# Patient Record
Sex: Male | Born: 1955 | Race: White | Hispanic: No | Marital: Single | Smoking: Never smoker
Health system: Southern US, Community
[De-identification: ages and names within clinical notes are randomized; demographics above are authoritative.]

## PROBLEM LIST (undated history)

## (undated) DIAGNOSIS — N400 Enlarged prostate without lower urinary tract symptoms: Secondary | ICD-10-CM

## (undated) DIAGNOSIS — M1990 Unspecified osteoarthritis, unspecified site: Secondary | ICD-10-CM

## (undated) DIAGNOSIS — I10 Essential (primary) hypertension: Secondary | ICD-10-CM

## (undated) DIAGNOSIS — K219 Gastro-esophageal reflux disease without esophagitis: Secondary | ICD-10-CM

## (undated) DIAGNOSIS — I719 Aortic aneurysm of unspecified site, without rupture: Secondary | ICD-10-CM

## (undated) HISTORY — PX: FINGER SURGERY: SHX640

## (undated) HISTORY — DX: Gastro-esophageal reflux disease without esophagitis: K21.9

## (undated) HISTORY — DX: Aortic aneurysm of unspecified site, without rupture: I71.9

## (undated) HISTORY — DX: Benign prostatic hyperplasia without lower urinary tract symptoms: N40.0

## (undated) HISTORY — PX: HERNIA REPAIR: SHX51

## (undated) HISTORY — PX: ELBOW SURGERY: SHX618

## (undated) HISTORY — DX: Essential (primary) hypertension: I10

## (undated) HISTORY — PX: OTHER SURGICAL HISTORY: SHX169

## (undated) HISTORY — DX: Unspecified osteoarthritis, unspecified site: M19.90

---

## 2013-08-21 DIAGNOSIS — M545 Low back pain: Secondary | ICD-10-CM

## 2013-08-21 DIAGNOSIS — G8929 Other chronic pain: Secondary | ICD-10-CM | POA: Insufficient documentation

## 2015-07-26 ENCOUNTER — Other Ambulatory Visit: Payer: Self-pay | Admitting: Physical Medicine and Rehabilitation

## 2015-07-26 DIAGNOSIS — M5416 Radiculopathy, lumbar region: Secondary | ICD-10-CM

## 2015-08-04 ENCOUNTER — Ambulatory Visit (HOSPITAL_COMMUNITY)
Admission: RE | Admit: 2015-08-04 | Discharge: 2015-08-04 | Disposition: A | Discharge status: Home / Self Care | Payer: Managed Care, Other (non HMO) | Source: Ambulatory Visit | Attending: Physical Medicine and Rehabilitation | Admitting: Physical Medicine and Rehabilitation

## 2015-08-04 DIAGNOSIS — M5117 Intervertebral disc disorders with radiculopathy, lumbosacral region: Secondary | ICD-10-CM | POA: Diagnosis not present

## 2015-08-04 DIAGNOSIS — M4727 Other spondylosis with radiculopathy, lumbosacral region: Secondary | ICD-10-CM | POA: Diagnosis not present

## 2015-08-04 DIAGNOSIS — M5416 Radiculopathy, lumbar region: Secondary | ICD-10-CM | POA: Insufficient documentation

## 2015-08-04 DIAGNOSIS — G8929 Other chronic pain: Secondary | ICD-10-CM | POA: Insufficient documentation

## 2015-08-04 DIAGNOSIS — M545 Low back pain: Secondary | ICD-10-CM | POA: Insufficient documentation

## 2015-08-04 DIAGNOSIS — Q631 Lobulated, fused and horseshoe kidney: Secondary | ICD-10-CM | POA: Insufficient documentation

## 2015-08-13 DIAGNOSIS — M5416 Radiculopathy, lumbar region: Secondary | ICD-10-CM | POA: Insufficient documentation

## 2015-08-13 DIAGNOSIS — M5126 Other intervertebral disc displacement, lumbar region: Secondary | ICD-10-CM | POA: Insufficient documentation

## 2017-05-18 ENCOUNTER — Encounter: Payer: Self-pay | Admitting: Urology

## 2017-05-18 ENCOUNTER — Ambulatory Visit (INDEPENDENT_AMBULATORY_CARE_PROVIDER_SITE_OTHER): Payer: Managed Care, Other (non HMO) | Admitting: Urology

## 2017-05-18 VITALS — BP 219/93 | HR 75 | Ht 73.0 in | Wt 212.3 lb

## 2017-05-18 DIAGNOSIS — Z125 Encounter for screening for malignant neoplasm of prostate: Secondary | ICD-10-CM | POA: Diagnosis not present

## 2017-05-18 DIAGNOSIS — N400 Enlarged prostate without lower urinary tract symptoms: Secondary | ICD-10-CM

## 2017-05-18 LAB — BLADDER SCAN AMB NON-IMAGING

## 2017-05-18 MED ORDER — ALFUZOSIN HCL ER 10 MG PO TB24: 10.0000 mg | ORAL_TABLET | Freq: Every day | ORAL | 11 refills | Status: DC

## 2017-05-18 NOTE — Progress Notes (Addendum)
05/18/2017 3:17 PM   Reginald Mckee 04-08-1956 [id number]  Referring provider: No referring provider defined for this encounter.  CC: BPH  HPI: The patient is a 61 year old gentleman who presents today for evaluation of his obstructive voiding symptoms.  The patient complains of nocturia x4.  He has a very slow stream.  He does not feel like he empties his bladder.  He denies significant straining.  He is increasingly bothered by his symptoms.  He also notes daytime frequency.  He has never tried medication for this before.  He has no recent PSA values.  He denies history of gross hematuria, nephrolithiasis, and UTI.  PVR: 50 cc  PMH: Past Medical History:  Diagnosis Date  . Acid reflux     Surgical History: Past Surgical History:  Procedure Laterality Date  . ELBOW SURGERY Right   . HERNIA REPAIR      Home Medications:  Allergies as of 05/18/2017   Not on File     Medication List    as of 05/18/2017  3:17 PM   You have not been prescribed any medications.     Allergies: Not on File  Family History: No family history on file.  Social History:  has no tobacco, alcohol, and drug history on file.  ROS: UROLOGY Frequent Urination?: Yes Hard to postpone urination?: Yes Burning/pain with urination?: No Get up at night to urinate?: Yes Leakage of urine?: Yes Urine stream starts and stops?: Yes Trouble starting stream?: Yes Do you have to strain to urinate?: No Blood in urine?: No Urinary tract infection?: No Sexually transmitted disease?: No Injury to kidneys or bladder?: No Painful intercourse?: No Weak stream?: Yes Erection problems?: Yes Penile pain?: No  Gastrointestinal Nausea?: No Vomiting?: No Indigestion/heartburn?: No Diarrhea?: No Constipation?: No  Constitutional Fever: No Night sweats?: No Weight loss?: No Fatigue?: No  Skin Skin rash/lesions?: No Itching?: No  Eyes Blurred vision?: No Double vision?:  No  Ears/Nose/Throat Sore throat?: No Sinus problems?: No  Hematologic/Lymphatic Swollen glands?: No Easy bruising?: No  Cardiovascular Leg swelling?: No Chest pain?: No  Respiratory Cough?: No Shortness of breath?: No  Endocrine Excessive thirst?: No  Musculoskeletal Back pain?: Yes Joint pain?: No  Neurological Headaches?: No Dizziness?: No  Psychologic Depression?: No Anxiety?: No  Physical Exam: BP (!) 219/93 (BP Location: Right Arm, Patient Position: Sitting, Cuff Size: Normal)   Pulse 75   Ht 6\' 1"  (1.854 m)   Wt 212 lb 4.8 oz (96.3 kg)   BMI 28.01 kg/m   Constitutional:  Alert and oriented, No acute distress. HEENT: [place] AT, moist mucus membranes.  Trachea midline, no masses. Cardiovascular: No clubbing, cyanosis, or edema. Respiratory: Normal respiratory effort, no increased work of breathing. GI: Abdomen is soft, nontender, nondistended, no abdominal masses GU: No CVA tenderness.  DRE 2+ smooth benign Skin: No rashes, bruises or suspicious lesions. Lymph: No cervical or inguinal adenopathy. Neurologic: Grossly intact, no focal deficits, moving all 4 extremities. Psychiatric: Normal mood and affect.  Laboratory Data: No results found for: WBC, HGB, HCT, MCV, PLT  No results found for: CREATININE  No results found for: PSA  No results found for: TESTOSTERONE  No results found for: HGBA1C  Urinalysis No results found for: COLORURINE, APPEARANCEUR, LABSPEC, PHURINE, GLUCOSEU, HGBUR, BILIRUBINUR, KETONESUR, PROTEINUR, UROBILINOGEN, NITRITE, LEUKOCYTESUR   Assessment & Plan:    1.  BPH We will start the patient on Uroxatrol which is what his insurance company prefers for alpha inhibitors.  He will follow-up in  3 months to assess his progress.  2.  Prostate cancer screening Due for PSA today.    Return in about 3 months (around 08/18/2017).  Nickie Retort, MD  [facility] [address] [place], [place] [address] [phone]

## 2017-05-19 LAB — URINALYSIS, COMPLETE
Bilirubin, UA: NEGATIVE
GLUCOSE, UA: NEGATIVE
KETONES UA: NEGATIVE
LEUKOCYTES UA: NEGATIVE
Nitrite, UA: NEGATIVE
RBC, UA: NEGATIVE
SPEC GRAV UA: 1.025 (ref 1.005–1.030)
Urobilinogen, Ur: 1 mg/dL (ref 0.2–1.0)
pH, UA: 5.5 (ref 5.0–7.5)

## 2017-05-19 LAB — MICROSCOPIC EXAMINATION
Epithelial Cells (non renal): NONE SEEN /hpf (ref 0–10)
RBC, UA: NONE SEEN /hpf (ref 0–?)
WBC UA: NONE SEEN /HPF (ref 0–?)

## 2017-05-19 LAB — FPSA% REFLEX
% FREE PSA: 23.6 %
PSA, FREE: 1.51 ng/mL

## 2017-05-19 LAB — PSA TOTAL (REFLEX TO FREE): Prostate Specific Ag, Serum: 6.4 ng/mL — ABNORMAL HIGH (ref 0.0–4.0)

## 2017-06-01 ENCOUNTER — Telehealth: Payer: Self-pay

## 2017-06-01 DIAGNOSIS — R972 Elevated prostate specific antigen [PSA]: Secondary | ICD-10-CM

## 2017-06-01 NOTE — Telephone Encounter (Signed)
Reginald Retort, MD  Lestine Box, LPN         Please let patient know his PSA was slightly elevated. He is already scheduled to follow-up in 3 months. He needs a PSA checked 1 week prior to this follow-up appointment to further workup the slight elevation. Thanks.    Spoke with pt in reference to PSA results and needing labs prior to 65mo f/u. Pt voiced understanding. Lab and OV appt made. Orders placed.

## 2017-07-27 ENCOUNTER — Other Ambulatory Visit: Payer: Managed Care, Other (non HMO)

## 2017-07-27 DIAGNOSIS — R972 Elevated prostate specific antigen [PSA]: Secondary | ICD-10-CM

## 2017-07-28 LAB — PSA: Prostate Specific Ag, Serum: 4.8 ng/mL — ABNORMAL HIGH (ref 0.0–4.0)

## 2017-08-10 ENCOUNTER — Encounter: Payer: Self-pay | Admitting: Urology

## 2017-08-10 ENCOUNTER — Ambulatory Visit (INDEPENDENT_AMBULATORY_CARE_PROVIDER_SITE_OTHER): Payer: Managed Care, Other (non HMO) | Admitting: Urology

## 2017-08-10 VITALS — BP 201/91 | HR 84 | Ht 73.0 in | Wt 227.4 lb

## 2017-08-10 DIAGNOSIS — N3281 Overactive bladder: Secondary | ICD-10-CM | POA: Diagnosis not present

## 2017-08-10 DIAGNOSIS — R972 Elevated prostate specific antigen [PSA]: Secondary | ICD-10-CM

## 2017-08-10 DIAGNOSIS — N400 Enlarged prostate without lower urinary tract symptoms: Secondary | ICD-10-CM

## 2017-08-10 NOTE — Progress Notes (Signed)
08/10/2017 10:39 AM   Reginald Mckee 1956-01-22 [id number]  Referring provider: No referring provider defined for this encounter.  Chief Complaint  Patient presents with  . Benign Prostatic Hypertrophy    HPI: The patient is a 62 year old gentleman who presents today for evaluation of his obstructive voiding symptoms.    1. BPH The patient complains of nocturia x4.  He has a very slow stream.  He does not feel like he empties his bladder.  He denies significant straining.  He is increasingly bothered by his symptoms.  He also notes daytime frequency.    At his visit in November 2018 he was started on Uroxatrol for his above symptoms.  He presents today for follow-up.  This is significantly helped his obstructive voiding symptoms.  He voids well with a good stream.  His biggest concern now is his nocturia x4-5.  He does have a history of sleep apnea diagnosed many years ago.  He does not use a CPAP.  He does however report that his nocturia voids are low volume.  2. Elevated PSA Patient's PSA was checked at his original appointment in November 2018.  It was 6.4.  Therefore he underwent a repeat PSA in January 2019 which though still elevated his PSA dropped to 4.8.  His DRE in November 2018 was 2+ smooth benign.       PMH: Past Medical History:  Diagnosis Date  . Acid reflux   . Arthritis     Surgical History: Past Surgical History:  Procedure Laterality Date  . ELBOW SURGERY Right   . HERNIA REPAIR      Home Medications:  Allergies as of 08/10/2017   No Known Allergies     Medication List        Accurate as of 08/10/17 10:39 AM. Always use your most recent med list.          alfuzosin 10 MG 24 hr tablet Commonly known as:  UROXATRAL Take 1 tablet (10 mg total) daily with breakfast by mouth.   meloxicam 15 MG tablet Commonly known as:  MOBIC TK 1 T PO QD PRN   methocarbamol 500 MG tablet Commonly known as:  ROBAXIN Take 500 mg by mouth 4 (four) times  daily.       Allergies: No Known Allergies  Family History: Family History  Problem Relation Age of Onset  . Prostate cancer Neg Hx   . Bladder Cancer Neg Hx   . Kidney cancer Neg Hx     Social History:  reports that  has never smoked. He has quit using smokeless tobacco. He reports that he drinks alcohol. He reports that he does not use drugs.  ROS: UROLOGY Frequent Urination?: Yes Hard to postpone urination?: No Burning/pain with urination?: No Get up at night to urinate?: Yes Leakage of urine?: No Urine stream starts and stops?: No Trouble starting stream?: No Do you have to strain to urinate?: No Blood in urine?: No Urinary tract infection?: No Sexually transmitted disease?: No Injury to kidneys or bladder?: No Painful intercourse?: No Weak stream?: Yes Erection problems?: No Penile pain?: No  Gastrointestinal Nausea?: No Vomiting?: No Indigestion/heartburn?: No Diarrhea?: No Constipation?: No  Constitutional Fever: No Night sweats?: No Weight loss?: No Fatigue?: No  Skin Skin rash/lesions?: No Itching?: No  Eyes Blurred vision?: No Double vision?: No  Ears/Nose/Throat Sore throat?: No Sinus problems?: No  Hematologic/Lymphatic Swollen glands?: No Easy bruising?: No  Cardiovascular Leg swelling?: No Chest pain?: No  Respiratory Cough?: No  Shortness of breath?: No  Endocrine Excessive thirst?: No  Musculoskeletal Back pain?: No Joint pain?: No  Neurological Headaches?: No Dizziness?: No  Psychologic Depression?: No Anxiety?: No  Physical Exam: BP (!) 201/91   Pulse 84   Ht 6\' 1"  (1.854 m)   Wt 227 lb 6.4 oz (103.1 kg)   BMI 30.00 kg/m   Constitutional:  Alert and oriented, No acute distress. HEENT: [place] AT, moist mucus membranes.  Trachea midline, no masses. Cardiovascular: No clubbing, cyanosis, or edema. Respiratory: Normal respiratory effort, no increased work of breathing. GI: Abdomen is soft, nontender,  nondistended, no abdominal masses GU: No CVA tenderness.  Skin: No rashes, bruises or suspicious lesions. Lymph: No cervical or inguinal adenopathy. Neurologic: Grossly intact, no focal deficits, moving all 4 extremities. Psychiatric: Normal mood and affect.  Laboratory Data: No results found for: WBC, HGB, HCT, MCV, PLT  No results found for: CREATININE  No results found for: PSA  No results found for: TESTOSTERONE  No results found for: HGBA1C  Urinalysis    Component Value Date/Time   APPEARANCEUR Clear 05/18/2017 1455   GLUCOSEU Negative 05/18/2017 1455   BILIRUBINUR Negative 05/18/2017 1455   PROTEINUR Trace (A) 05/18/2017 1455   NITRITE Negative 05/18/2017 1455   LEUKOCYTESUR Negative 05/18/2017 1455     Assessment & Plan:   1.  BPH Continue Uroxatrol  2.  Nocturia I discussed with the patient has nocturia could be for a number of reasons.  I am suspicious that his untreated sleep apnea may be a source of this.  However, we will try him on Myrbetriq 25 mg at this time.  If this is cost prohibitive we can try an anticholinergic.  We discussed that if this does not work the recommend he obtains a primary care provider as well as a sleep study.  3.  Elevated PSA I did discuss the patient has PSA does technically remain elevated.  However he did have a fairly significant drop in the last 3 months.  I am hesitant to push him for a biopsy immediately due to this drop.  He will obtain a repeat PSA in 3 months prior to his next follow-up.  We may need to consider a prostate biopsy at that time.  Return in about 3 months (around 11/07/2017) for PSA prior.  Nickie Retort, MD  [facility] [address] [place], [place] [address] [phone]

## 2017-11-02 ENCOUNTER — Other Ambulatory Visit: Payer: Managed Care, Other (non HMO)

## 2017-11-02 DIAGNOSIS — R972 Elevated prostate specific antigen [PSA]: Secondary | ICD-10-CM

## 2017-11-03 LAB — PSA: PROSTATE SPECIFIC AG, SERUM: 4.1 ng/mL — AB (ref 0.0–4.0)

## 2017-11-16 ENCOUNTER — Ambulatory Visit (INDEPENDENT_AMBULATORY_CARE_PROVIDER_SITE_OTHER): Payer: Managed Care, Other (non HMO) | Admitting: Urology

## 2017-11-16 ENCOUNTER — Encounter: Payer: Self-pay | Admitting: Urology

## 2017-11-16 VITALS — BP 197/87 | HR 80 | Resp 16 | Ht 73.0 in | Wt 228.0 lb

## 2017-11-16 DIAGNOSIS — N400 Enlarged prostate without lower urinary tract symptoms: Secondary | ICD-10-CM | POA: Diagnosis not present

## 2017-11-16 DIAGNOSIS — R972 Elevated prostate specific antigen [PSA]: Secondary | ICD-10-CM

## 2017-11-16 MED ORDER — FINASTERIDE 5 MG PO TABS: 5.0000 mg | ORAL_TABLET | Freq: Every day | ORAL | 11 refills | Status: DC

## 2017-11-16 NOTE — Progress Notes (Signed)
11/16/2017 8:44 AM   Reginald Mckee [birth year] [id number]  Referring provider: No referring provider defined for this encounter.  Chief Complaint  Patient presents with  . Benign Prostatic Hypertrophy    HPI: The patient is a 62 year old gentleman who presents today   1. BPH Originally, the patient presented with nocturia x4.  He had a very slow stream.  He did not feel like he emptie his bladder.  He denied significant straining.  He was increasingly bothered by his symptoms.  He also noted daytime frequency.  He had never tried medication for this before.    He was started on Uroxatrol, which is what his insurance company for a few, and presents today for follow-up.  This resulted in significant improvement in his obstructive symptoms.  He however continued to have nocturia x4-5.  It was also noted at his last visit that he had sleep apnea did not use his CPAP.  He was informed that this is likely contributing to his nocturia.  He was also started Myrbetriq 25 mg to see if this would help decrease his nocturia.  The Myrbetriq did not help his urinary symptoms, so he is since stopped it.  He still complains of the obstructive voiding symptoms as well as nocturia x4-5.  He has not been evaluated for sleep apnea.   2.  Elevated PSA Patient presents today for follow-up with history of elevated PSA.  His PSA was 6.4 in November 2018.  It was 4.8 in January 2019.  It fell to 4.1 in May 2019.  DRE November 2018 was 2+ smooth benign.       PMH: Past Medical History:  Diagnosis Date  . Acid reflux   . Arthritis     Surgical History: Past Surgical History:  Procedure Laterality Date  . ELBOW SURGERY Right   . HERNIA REPAIR      Home Medications:  Allergies as of 11/16/2017   No Known Allergies     Medication List        Accurate as of 11/16/17  8:44 AM. Always use your most recent med list.          alfuzosin 10 MG 24 hr tablet Commonly known as:  UROXATRAL Take 1  tablet (10 mg total) daily with breakfast by mouth.   meloxicam 15 MG tablet Commonly known as:  MOBIC TK 1 T PO QD PRN   methocarbamol 500 MG tablet Commonly known as:  ROBAXIN Take 500 mg by mouth 4 (four) times daily.       Allergies: No Known Allergies  Family History: Family History  Problem Relation Age of Onset  . Prostate cancer Neg Hx   . Bladder Cancer Neg Hx   . Kidney cancer Neg Hx     Social History:  reports that he has never smoked. He has quit using smokeless tobacco. He reports that he drinks alcohol. He reports that he does not use drugs.  ROS: UROLOGY Frequent Urination?: Yes Hard to postpone urination?: Yes Burning/pain with urination?: No Get up at night to urinate?: Yes Leakage of urine?: Yes Urine stream starts and stops?: Yes Trouble starting stream?: Yes Do you have to strain to urinate?: No Blood in urine?: No Urinary tract infection?: No Sexually transmitted disease?: No Injury to kidneys or bladder?: No Painful intercourse?: No Weak stream?: Yes Erection problems?: Yes Penile pain?: No  Gastrointestinal Nausea?: No Vomiting?: No Indigestion/heartburn?: No Diarrhea?: No Constipation?: No  Constitutional Fever: No Night sweats?: No Weight loss?:  No Fatigue?: No  Skin Skin rash/lesions?: No Itching?: No  Eyes Blurred vision?: No Double vision?: No  Ears/Nose/Throat Sore throat?: No Sinus problems?: No  Hematologic/Lymphatic Swollen glands?: No Easy bruising?: No  Cardiovascular Leg swelling?: Yes Chest pain?: No  Respiratory Cough?: No Shortness of breath?: No  Endocrine Excessive thirst?: No  Musculoskeletal Back pain?: No Joint pain?: No  Neurological Headaches?: No Dizziness?: No  Psychologic Depression?: No Anxiety?: No  Physical Exam: BP (!) 197/87   Pulse 80   Resp 16   Ht 6\' 1"  (1.854 m)   Wt 228 lb (103.4 kg)   SpO2 98%   BMI 30.08 kg/m   Constitutional:  Alert and oriented, No  acute distress. HEENT: [place] AT, moist mucus membranes.  Trachea midline, no masses. Cardiovascular: No clubbing, cyanosis, or edema. Respiratory: Normal respiratory effort, no increased work of breathing. GI: Abdomen is soft, nontender, nondistended, no abdominal masses GU: No CVA tenderness.  Skin: No rashes, bruises or suspicious lesions. Lymph: No cervical or inguinal adenopathy. Neurologic: Grossly intact, no focal deficits, moving all 4 extremities. Psychiatric: Normal mood and affect.  Laboratory Data: No results found for: WBC, HGB, HCT, MCV, PLT  No results found for: CREATININE  No results found for: PSA  No results found for: TESTOSTERONE  No results found for: HGBA1C  Urinalysis    Component Value Date/Time   APPEARANCEUR Clear 05/18/2017 1455   GLUCOSEU Negative 05/18/2017 1455   BILIRUBINUR Negative 05/18/2017 1455   PROTEINUR Trace (A) 05/18/2017 1455   NITRITE Negative 05/18/2017 1455   LEUKOCYTESUR Negative 05/18/2017 1455    Assessment & Plan:    1. BPH/OAB -Continue Uroxatrol -We will add finasteride to his regimen.  He was informed that it will take up to 6 months to have its full effect. -Again encouraged him to find a PCP as well as undergo a sleep study as this is the cause of his nocturia until proven otherwise.  He also was again strongly encouraged to see a PCP or cardiologist for his significantly elevated blood pressure which per patient report he has had for 30 years. -Follow-up in 6 months to assess progress.  2. Elevated PSA The patient's PSA is still slightly elevated, however it has decreased over 2 points since 6 months ago.  For now, we will repeat his PSA in 6 months to ensure this downward trend continues. (Will need to take in account that the patient is starting finasteride today when interpreting future results).  Return in about 6 months (around 05/19/2018) for PSA prior.  Nickie Retort, MD  [facility] [address] [place], [place] [address] [phone]

## 2017-12-30 NOTE — Progress Notes (Signed)
Cardiology Office Note  Date:  01/02/2018   ID:  Reginald Mckee, DOB Mar 12, 1956, MRN [id number]  PCP:  Patient, No Pcp Per  Urology:Reginald Mckee  Chief Complaint  Patient presents with  . other    Elevated BP c/o occassional flutter sensation. Meds reviewed verbally with pt.    HPI:  [name] Is a 62 year old gentleman with past medical history of BPH Elevated PSA Hypertension Remote social smoker Chronic back problem GERD History of leg swelling Referred by Reginald Mckee for consultation of his hypertension  He does not measure blood pressure at home Blood pressure 197/87 when seen by urology 11/16/2017 Blood pressure 201/91 when seen by urology 08/10/2017 Blood pressure 219/93 when seen by urology 05/18/2017 Blood pressure today 210/80 Reports he is asymptomatic from his pressures  One episode several weeks ago was walking in [place] 30 150 yards at a fast rate Had fast heart rate felt palpitation, felt like his heart stopped for second and then restarted Other than that he has felt fine 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  PSH:    Past Surgical History:  Procedure Laterality Date  . ELBOW SURGERY Right   . HERNIA REPAIR      Current Outpatient Medications  Medication Sig Dispense Refill  . alfuzosin (UROXATRAL) 10 MG 24 hr tablet Take 1 tablet (10 mg total) daily with breakfast by mouth. 30 tablet 11  . finasteride (PROSCAR) 5 MG tablet Take 1 tablet (5 mg total) by mouth daily. 30 tablet 11  . meloxicam (MOBIC) 15 MG tablet TK 1 T PO QD PRN  5  . methocarbamol (ROBAXIN) 500 MG tablet Take 500 mg by mouth as  needed.     . ranitidine (ZANTAC) 150 MG tablet Take 150 mg by mouth as needed for heartburn.    Marland Kitchen amLODipine-valsartan (EXFORGE) 5-320 MG tablet Take 1 tablet by mouth daily. 30 tablet 11   No current facility-administered medications for this visit.      Allergies:   Patient has no known allergies.   Social History:  The patient  reports that he has never smoked. He has quit using smokeless tobacco. He reports that he drinks alcohol. He reports that he does not use drugs.   Family History:   family history includes Atrial fibrillation in his father.    Review of Systems: Review of Systems  Constitutional: Negative.   Respiratory: Negative.   Cardiovascular: Negative.   Gastrointestinal: Negative.   Musculoskeletal: Positive for back pain.  Neurological: Negative.   Psychiatric/Behavioral: Negative.   All other systems reviewed and are negative.    PHYSICAL EXAM: VS:  BP (!) 202/83 (BP Location: Right Arm, Patient Position: Sitting, Cuff Size: Large)   Pulse 70   Ht 6\' 1"  (1.854 m)   Wt 235 lb (106.6 kg)   BMI 31.00 kg/m  , BMI Body mass index is 31 kg/m. GEN: Well nourished, well developed, in no acute distress  HEENT: normal  Neck: no JVD, carotid bruits, or masses Cardiac: RRR; no murmurs, rubs, or gallops,no edema  Respiratory:  clear  to auscultation bilaterally, normal work of breathing GI: soft, nontender, nondistended, + BS MS: no deformity or atrophy  Skin: warm and dry, no rash Neuro:  Strength and sensation are intact Psych: euthymic mood, full affect    Recent Labs: No results found for requested labs within last 8760 hours.    Lipid Panel No results found for: CHOL, HDL, LDLCALC, TRIG    Wt Readings from Last 3 Encounters:  01/02/18 235 lb (106.6 kg)  11/16/17 228 lb (103.4 kg)  08/10/17 227 lb 6.4 oz (103.1 kg)       ASSESSMENT AND PLAN:  Abnormal EKG Nonspecific ST and T wave abnormality Discussed with him in detail, unable to  exclude ischemia but he denies any anginal symptoms Long discussion concerning anginal symptoms to watch for, recommended he call us for any concerning symptoms especially with exertion  Malignant hypertension - Plan: EKG 12-Lead Long discussion concerning need to control his blood pressure We'll start amlodipine valsartan 5/360 mg daily He will buy a blood pressure cuff and call us in the next week or 2 with a pressure measurements Will start on low-dose amlodipine given trace lower extremity edema, history of leg swelling Other medications that could be initiated including HCTZ, potentially higher dose amlodipine, clonidine, Cardura  Benign prostatic hyperplasia, unspecified whether lower urinary tract symptoms present Managed by urology Elevated PSA Reports having nocturia Potentially could add Flomax which might help his blood pressure  GERD Recommended he may want to add omeprazole daily given severity of his symptoms Take Pepcid 1-2 tabs as needed for breakthrough symptoms  Disposition:   He will call with BP measurements Further adjustment based on his numbers Will likely need follow-up basic metabolic panel   Total encounter time more than 60 minutes  Greater than 50% was spent in counseling and coordination of care with the patient  Patient seen in consultation for Dr. Baruch Mckee and will be referred  back to his office for ongoing care of issues detailed above   Orders Placed This Encounter  Procedures  . EKG 12-Lead     Signed, Esmond Plants, M.D., Ph.D. 01/02/2018  [place], [place]

## 2018-01-02 ENCOUNTER — Encounter

## 2018-01-02 ENCOUNTER — Encounter: Payer: Self-pay | Admitting: Cardiovascular Disease

## 2018-01-02 ENCOUNTER — Ambulatory Visit (INDEPENDENT_AMBULATORY_CARE_PROVIDER_SITE_OTHER): Payer: Managed Care, Other (non HMO) | Admitting: Cardiovascular Disease

## 2018-01-02 VITALS — BP 202/83 | HR 70 | Ht 73.0 in | Wt 235.0 lb

## 2018-01-02 DIAGNOSIS — R9431 Abnormal electrocardiogram [ECG] [EKG]: Secondary | ICD-10-CM | POA: Insufficient documentation

## 2018-01-02 DIAGNOSIS — R972 Elevated prostate specific antigen [PSA]: Secondary | ICD-10-CM

## 2018-01-02 DIAGNOSIS — K219 Gastro-esophageal reflux disease without esophagitis: Secondary | ICD-10-CM

## 2018-01-02 DIAGNOSIS — N400 Enlarged prostate without lower urinary tract symptoms: Secondary | ICD-10-CM | POA: Diagnosis not present

## 2018-01-02 DIAGNOSIS — I10 Essential (primary) hypertension: Secondary | ICD-10-CM

## 2018-01-02 MED ORDER — AMLODIPINE BESYLATE-VALSARTAN 5-320 MG PO TABS: 1.0000 | ORAL_TABLET | Freq: Every day | ORAL | 11 refills | Status: DC

## 2018-01-02 NOTE — Patient Instructions (Addendum)
Medication Instructions:   Please start amlodipine valsartan one a day  Labwork:  No new labs needed  Testing/Procedures:  No further testing at this time   Follow-Up: It was a pleasure seeing you in the office today. Please call us if you have new issues that need to be addressed before your next appt.  [phone]  Your physician wants you to follow-up in:  Please call with blood pressure measurements over the next few weeks  If you need a refill on your cardiac medications before your next appointment, please call your pharmacy.  For educational health videos Log in to : [email or web address] Or : [email or web address], password : [email or web address]

## 2018-01-04 ENCOUNTER — Telehealth: Payer: Self-pay | Admitting: Cardiovascular Disease

## 2018-01-04 NOTE — Addendum Note (Signed)
Addended by: Valora Corporal on: 01/04/2018 05:10 PM   Modules accepted: Orders

## 2018-01-04 NOTE — Telephone Encounter (Signed)
Spoke with patient and reviewed that Dr. Rockey Situ would like him to have some labs done when possible. Reviewed labs ordered and instructed him to go to [facility] Entrance to have those done. He was agreeable with no further questions. He did report blood pressures on first day of medication was 145/80 and in the PM it was 160/80. He verbalized understanding to continue monitoring and that I would call him back with results. He had no further concerns at this time.

## 2018-01-11 ENCOUNTER — Other Ambulatory Visit
Admission: RE | Admit: 2018-01-11 | Discharge: 2018-01-11 | Disposition: A | Discharge status: Home / Self Care | Payer: Managed Care, Other (non HMO) | Source: Ambulatory Visit | Attending: Cardiovascular Disease | Admitting: Cardiovascular Disease

## 2018-01-11 DIAGNOSIS — I10 Essential (primary) hypertension: Secondary | ICD-10-CM | POA: Diagnosis not present

## 2018-01-11 LAB — LIPID PANEL
CHOL/HDL RATIO: 2.6 ratio
Cholesterol: 148 mg/dL (ref 0–200)
HDL: 56 mg/dL (ref >40)
LDL CALC: 82 mg/dL (ref 0–99)
TRIGLYCERIDES: 51 mg/dL (ref <150)
VLDL: 10 mg/dL (ref 0–40)

## 2018-01-11 LAB — COMPREHENSIVE METABOLIC PANEL
ALT: 16 U/L (ref 0–44)
ANION GAP: 8 (ref 5–15)
AST: 18 U/L (ref 15–41)
Albumin: 4.2 g/dL (ref 3.5–5.0)
Alkaline Phosphatase: 41 U/L (ref 38–126)
BUN: 18 mg/dL (ref 6–20)
CALCIUM: 8.7 mg/dL — AB (ref 8.9–10.3)
CO2: 27 mmol/L (ref 22–32)
Chloride: 105 mmol/L (ref 98–111)
Creatinine, Ser: 0.95 mg/dL (ref 0.61–1.24)
GFR calc non Af Amer: >60 mL/min (ref >60)
GLUCOSE: 117 mg/dL — AB (ref 70–99)
POTASSIUM: 3.9 mmol/L (ref 3.5–5.1)
SODIUM: 140 mmol/L (ref 135–145)
Total Bilirubin: 1 mg/dL (ref 0.3–1.2)
Total Protein: 6.3 g/dL — ABNORMAL LOW (ref 6.5–8.1)

## 2018-01-11 LAB — BILIRUBIN, DIRECT: Bilirubin, Direct: 0.2 mg/dL (ref 0.0–0.2)

## 2018-01-12 ENCOUNTER — Encounter: Payer: Self-pay | Admitting: Cardiovascular Disease

## 2018-04-29 ENCOUNTER — Other Ambulatory Visit: Payer: Managed Care, Other (non HMO)

## 2018-04-29 DIAGNOSIS — R972 Elevated prostate specific antigen [PSA]: Secondary | ICD-10-CM

## 2018-04-30 LAB — PSA: Prostate Specific Ag, Serum: 4.3 ng/mL — ABNORMAL HIGH (ref 0.0–4.0)

## 2018-05-03 ENCOUNTER — Other Ambulatory Visit: Payer: Managed Care, Other (non HMO)

## 2018-05-10 ENCOUNTER — Other Ambulatory Visit: Payer: Self-pay

## 2018-05-10 DIAGNOSIS — N400 Enlarged prostate without lower urinary tract symptoms: Secondary | ICD-10-CM

## 2018-05-10 MED ORDER — ALFUZOSIN HCL ER 10 MG PO TB24: 10.0000 mg | ORAL_TABLET | Freq: Every day | ORAL | 11 refills | Status: DC

## 2018-05-17 ENCOUNTER — Encounter: Payer: Self-pay | Admitting: Urology

## 2018-05-17 ENCOUNTER — Ambulatory Visit (INDEPENDENT_AMBULATORY_CARE_PROVIDER_SITE_OTHER): Payer: Managed Care, Other (non HMO) | Admitting: Urology

## 2018-05-17 ENCOUNTER — Ambulatory Visit: Payer: Managed Care, Other (non HMO)

## 2018-05-17 VITALS — BP 162/62 | HR 75 | Ht 72.0 in | Wt 225.0 lb

## 2018-05-17 DIAGNOSIS — N3281 Overactive bladder: Secondary | ICD-10-CM | POA: Diagnosis not present

## 2018-05-17 DIAGNOSIS — R972 Elevated prostate specific antigen [PSA]: Secondary | ICD-10-CM | POA: Diagnosis not present

## 2018-05-17 DIAGNOSIS — N400 Enlarged prostate without lower urinary tract symptoms: Secondary | ICD-10-CM | POA: Diagnosis not present

## 2018-05-17 DIAGNOSIS — M549 Dorsalgia, unspecified: Secondary | ICD-10-CM | POA: Insufficient documentation

## 2018-05-17 LAB — BLADDER SCAN AMB NON-IMAGING

## 2018-05-17 NOTE — Progress Notes (Addendum)
05/17/2018 10:34 AM   Reginald Mckee 09-10-1955 [id number]  Referring provider: No referring provider defined for this encounter.  Chief Complaint  Patient presents with  . Elevated PSA  . Benign Prostatic Hypertrophy   Urologic history: 1.  Elevated PSA - PSA low to mid 4s; has elected surveillance  2.  BPH with lower urinary tract symptoms -Alfuzosin/finasteride   HPI: 62 year old male presents for a 36-month follow-up.  He has previously seen Dr. Pilar Jarvis.  At his last visit in May 2019 he was complaining of nocturia x4-5.  He was on an alpha-blocker at the time.  He had also been on Myrbetriq which was not beneficial.  At his last visit he was started on finasteride.  He does not feel he has had significant improvement on this medication and does feel it has worsened his ED.  Has nocturia has significantly improved down to 1-2 times per night however this occurred shortly after starting finasteride and he had not been on the medication long enough.  A PSA performed on 10/28 was 4.3 (uncorrected).  PSA trend as follows: 05/2017 6.4 07/2017  4.8 10/2017  4.1 04/2018 4.3 (uncorrected)   PMH: Past Medical History:  Diagnosis Date  . Acid reflux   . Arthritis     Surgical History: Past Surgical History:  Procedure Laterality Date  . ELBOW SURGERY Right   . HERNIA REPAIR      Home Medications:  Allergies as of 05/17/2018   No Known Allergies     Medication List        Accurate as of 05/17/18 10:34 AM. Always use your most recent med list.          alfuzosin 10 MG 24 hr tablet Commonly known as:  UROXATRAL Take 1 tablet (10 mg total) by mouth daily with breakfast.   amLODipine-valsartan 5-320 MG tablet Commonly known as:  EXFORGE Take 1 tablet by mouth daily.   finasteride 5 MG tablet Commonly known as:  PROSCAR Take 1 tablet (5 mg total) by mouth daily.   meloxicam 15 MG tablet Commonly known as:  MOBIC TK 1 T PO QD PRN   methocarbamol 500 MG  tablet Commonly known as:  ROBAXIN Take 500 mg by mouth as needed.   ranitidine 150 MG tablet Commonly known as:  ZANTAC Take 150 mg by mouth as needed for heartburn.       Allergies: No Known Allergies  Family History: Family History  Problem Relation Age of Onset  . Atrial fibrillation Father   . Prostate cancer Neg Hx   . Bladder Cancer Neg Hx   . Kidney cancer Neg Hx     Social History:  reports that he has never smoked. He has quit using smokeless tobacco. He reports that he drinks alcohol. He reports that he does not use drugs.  ROS: UROLOGY Frequent Urination?: No Hard to postpone urination?: No Burning/pain with urination?: No Get up at night to urinate?: Yes Leakage of urine?: Yes Urine stream starts and stops?: Yes Trouble starting stream?: No Do you have to strain to urinate?: No Blood in urine?: No Urinary tract infection?: No Sexually transmitted disease?: No Injury to kidneys or bladder?: No Painful intercourse?: No Weak stream?: No Erection problems?: Yes Penile pain?: No  Gastrointestinal Nausea?: No Vomiting?: No Indigestion/heartburn?: No Diarrhea?: No Constipation?: No  Constitutional Fever: No Night sweats?: No Weight loss?: No Fatigue?: No  Skin Skin rash/lesions?: No Itching?: No  Eyes Blurred vision?: No Double vision?: No  Ears/Nose/Throat Sore throat?: No Sinus problems?: No  Hematologic/Lymphatic Swollen glands?: No Easy bruising?: No  Cardiovascular Leg swelling?: No Chest pain?: No  Respiratory Cough?: No Shortness of breath?: No  Endocrine Excessive thirst?: No  Musculoskeletal Back pain?: Yes Joint pain?: No  Neurological Headaches?: No Dizziness?: No  Psychologic Depression?: No Anxiety?: No  Physical Exam: BP (!) 162/62 (BP Location: Left Arm, Patient Position: Sitting, Cuff Size: Large)   Pulse 75   Ht 6' (1.829 m)   Wt 225 lb (102.1 kg)   BMI 30.52 kg/m   Constitutional:  Alert and  oriented, No acute distress. HEENT: [place] AT, moist mucus membranes.  Trachea midline, no masses. Cardiovascular: No clubbing, cyanosis, or edema. Respiratory: Normal respiratory effort, no increased work of breathing. GI: Abdomen is soft, nontender, nondistended, no abdominal masses GU: No CVA tenderness.  Prostate 60 g, smooth without nodules Lymph: No cervical or inguinal lymphadenopathy. Skin: No rashes, bruises or suspicious lesions. Neurologic: Grossly intact, no focal deficits, moving all 4 extremities. Psychiatric: Normal mood and affect.   Assessment & Plan:   62 year old male with BPH/lower urinary tract symptoms.  Nocturia has improved.  No significant benefit on finasteride with some worsening of his ED and will discontinue.  He will continue the alpha-blocker.  Surgical treatment options were discussed including TURP/PVP and minimally invasive option of UroLift.  His most recent PSA did not decrease appropriately on finasteride.  He does not desire to pursue biopsy.  I did recommend a prostate MRI which he was agreeable and will see if it is covered by insurance.   Return in about 6 months (around 11/15/2018) for Recheck, PSA.  [name], [place] [address] [place],  [address] [phone]

## 2018-05-20 ENCOUNTER — Encounter: Payer: Self-pay | Admitting: Urology

## 2018-06-09 ENCOUNTER — Ambulatory Visit
Admission: RE | Admit: 2018-06-09 | Discharge: 2018-06-09 | Disposition: A | Discharge status: Home / Self Care | Payer: Managed Care, Other (non HMO) | Source: Ambulatory Visit | Attending: Urology | Admitting: Urology

## 2018-06-09 DIAGNOSIS — R972 Elevated prostate specific antigen [PSA]: Secondary | ICD-10-CM

## 2018-06-09 MED ORDER — GADOBENATE DIMEGLUMINE 529 MG/ML IV SOLN
20.0000 mL | Freq: Once | INTRAVENOUS | Status: AC | PRN
  Administered 2018-06-09: 20 mL via INTRAVENOUS

## 2018-06-11 ENCOUNTER — Telehealth: Payer: Self-pay | Admitting: Family Medicine

## 2018-06-11 NOTE — Telephone Encounter (Signed)
-----   Message from [name], MD sent at 06/11/2018  8:58 AM EST ----- Prostate MRI showed no abnormalities suspicious for prostate cancer.  Follow-up as scheduled.

## 2018-06-11 NOTE — Telephone Encounter (Signed)
Patient notified and voiced understanding.

## 2018-10-16 ENCOUNTER — Other Ambulatory Visit: Payer: Self-pay

## 2018-10-16 DIAGNOSIS — C61 Malignant neoplasm of prostate: Secondary | ICD-10-CM

## 2018-10-17 ENCOUNTER — Other Ambulatory Visit: Payer: Self-pay

## 2018-10-17 ENCOUNTER — Other Ambulatory Visit: Payer: Managed Care, Other (non HMO)

## 2018-10-17 DIAGNOSIS — C61 Malignant neoplasm of prostate: Secondary | ICD-10-CM

## 2018-10-18 ENCOUNTER — Other Ambulatory Visit: Payer: Managed Care, Other (non HMO)

## 2018-10-18 LAB — PSA: Prostate Specific Ag, Serum: 3.9 ng/mL (ref 0.0–4.0)

## 2018-10-29 ENCOUNTER — Encounter: Payer: Self-pay | Admitting: Urology

## 2018-10-29 ENCOUNTER — Ambulatory Visit (INDEPENDENT_AMBULATORY_CARE_PROVIDER_SITE_OTHER): Payer: Managed Care, Other (non HMO) | Admitting: Urology

## 2018-10-29 ENCOUNTER — Other Ambulatory Visit: Payer: Self-pay

## 2018-10-29 VITALS — BP 150/74 | HR 73 | Ht 72.0 in | Wt 225.0 lb

## 2018-10-29 DIAGNOSIS — N5201 Erectile dysfunction due to arterial insufficiency: Secondary | ICD-10-CM

## 2018-10-29 DIAGNOSIS — N401 Enlarged prostate with lower urinary tract symptoms: Secondary | ICD-10-CM | POA: Diagnosis not present

## 2018-10-29 MED ORDER — TADALAFIL 5 MG PO TABS: 5.0000 mg | ORAL_TABLET | Freq: Every day | ORAL | 5 refills | Status: DC

## 2018-10-29 NOTE — Progress Notes (Signed)
10/29/2018 6:55 PM   Reginald Mckee Aug 29, 1955 [id number]  Referring provider: No referring provider defined for this encounter.  Chief Complaint  Patient presents with  . Follow-up   Urologic history: 1.  Elevated PSA - PSA low to mid 4s; has elected surveillance  2.  BPH with lower urinary tract symptoms -Alfuzosin/finasteride  HPI: 63 year old male presents for a semiannual follow-up.  At his visit last November he had been on finasteride for 6 months and uncorrected PSA at that visit was 4.3.  A prostate MRI was performed which showed no lesions suspicious for high-grade prostate cancer.  Prostate volume was calculated at 84 cc.   He elected surveillance and states he stopped the finasteride after our visit in November 2019.  He saw no worsening of his lower urinary tract symptoms after stopping the medication.  His voiding pattern is stable but symptoms are bothersome.  Denies dysuria, gross hematuria or flank/abdominal/pelvic/scrotal pain.  A repeat PSA on 10/17/2018 was 3.9.  PMH: Past Medical History:  Diagnosis Date  . Acid reflux   . Arthritis     Surgical History: Past Surgical History:  Procedure Laterality Date  . ELBOW SURGERY Right   . HERNIA REPAIR      Home Medications:  Allergies as of 10/29/2018   No Known Allergies     Medication List       Accurate as of October 29, 2018  6:55 PM. Always use your most recent med list.        alfuzosin 10 MG 24 hr tablet Commonly known as:  UROXATRAL Take 1 tablet (10 mg total) by mouth daily with breakfast.   amLODipine-valsartan 5-320 MG tablet Commonly known as:  EXFORGE Take 1 tablet by mouth daily.   meloxicam 15 MG tablet Commonly known as:  MOBIC TK 1 T PO QD PRN   methocarbamol 500 MG tablet Commonly known as:  ROBAXIN Take 500 mg by mouth as needed.   ranitidine 150 MG tablet Commonly known as:  ZANTAC Take 150 mg by mouth as needed for heartburn.       Allergies: No Known  Allergies  Family History: Family History  Problem Relation Age of Onset  . Atrial fibrillation Father   . Prostate cancer Neg Hx   . Bladder Cancer Neg Hx   . Kidney cancer Neg Hx     Social History:  reports that he has never smoked. He has quit using smokeless tobacco. He reports current alcohol use. He reports that he does not use drugs.  ROS: UROLOGY Frequent Urination?: No Hard to postpone urination?: No Burning/pain with urination?: No Get up at night to urinate?: No Leakage of urine?: No Urine stream starts and stops?: No Trouble starting stream?: No Do you have to strain to urinate?: No Blood in urine?: No Urinary tract infection?: No Sexually transmitted disease?: No Injury to kidneys or bladder?: No Painful intercourse?: No Weak stream?: No Erection problems?: No Penile pain?: No  Gastrointestinal Nausea?: No Vomiting?: No Indigestion/heartburn?: No Diarrhea?: No Constipation?: No  Constitutional Fever: No Night sweats?: No Weight loss?: No Fatigue?: No  Skin Skin rash/lesions?: No Itching?: No  Eyes Blurred vision?: No Double vision?: No  Ears/Nose/Throat Sore throat?: No Sinus problems?: No  Hematologic/Lymphatic Swollen glands?: No Easy bruising?: No  Cardiovascular Leg swelling?: No Chest pain?: No  Respiratory Cough?: No Shortness of breath?: No  Endocrine Excessive thirst?: No  Musculoskeletal Back pain?: No Joint pain?: No  Neurological Headaches?: No Dizziness?: No  Psychologic  Depression?: No Anxiety?: No  Physical Exam: BP (!) 150/74 (BP Location: Left Arm, Patient Position: Sitting, Cuff Size: Normal)   Pulse 73   Ht 6' (1.829 m)   Wt 225 lb (102.1 kg)   BMI 30.52 kg/m   Constitutional:  Alert and oriented, No acute distress. HEENT: [place] AT, moist mucus membranes.  Trachea midline, no masses. Cardiovascular: No clubbing, cyanosis, or edema. Respiratory: Normal respiratory effort, no increased work of  breathing. Neurologic: Grossly intact, no focal deficits, moving all 4 extremities. Psychiatric: Normal mood and affect.   Assessment & Plan:   PSA on finasteride x6 months today was stable at 3.9.  Recent prostate MRI showed no lesions suspicious for high-grade prostate cancer.  He does have bothersome lower urinary tract symptoms.  I again discussed outlet procedures however he states his symptoms are not bothersome enough that he wants to pursue at this time.  He does have ED and discussed low-dose tadalafil for both BPH and ED and he desires to try.  Rx was sent to his pharmacy.  Follow-up 6 months.   [name], [place] [address] [place], [place] [address] [phone]

## 2018-11-01 ENCOUNTER — Ambulatory Visit: Payer: Managed Care, Other (non HMO) | Admitting: Urology

## 2018-12-05 ENCOUNTER — Other Ambulatory Visit: Payer: Self-pay | Admitting: Cardiovascular Disease

## 2018-12-05 NOTE — Telephone Encounter (Signed)
Lmovm to verify which dose pt is taking.

## 2018-12-06 NOTE — Telephone Encounter (Signed)
Please advise if ok to refill Pt is taking Amlodipine-Valsartan 5-320 mg qd.

## 2018-12-06 NOTE — Telephone Encounter (Signed)
Lmovm to verify amlodipine valsartan current dose.

## 2018-12-06 NOTE — Telephone Encounter (Signed)
Patient returning call  States he takes the 5MG  amlodipine 320MG  valsartan - 1 daily  Please call with any other questions or concerns

## 2018-12-29 IMAGING — MR MR PROSTATE WO/W CM
56 series · 56 of 56 positions shown · IV contrast (20 ml multihance)
Comparison: None.

CLINICAL DATA: Elevated PSA.

Creatinine was obtained on site at [HOSPITAL] at [HOSPITAL].
Results: Creatinine 0.9 mg/dL.
EXAM:
MR PROSTATE WITHOUT AND WITH CONTRAST
TECHNIQUE: Multiplanar multisequence MRI images were obtained of the pelvis
centered about the prostate. Pre and post contrast images were
obtained.
CONTRAST:  20mL MULTIHANCE GADOBENATE DIMEGLUMINE 529 MG/ML IV SOLN

[Series 3: T1 · axial · 8.0mm · 1.06mm/px · 1 of 28 slices shown (1 of 2)]
[im 1/28]
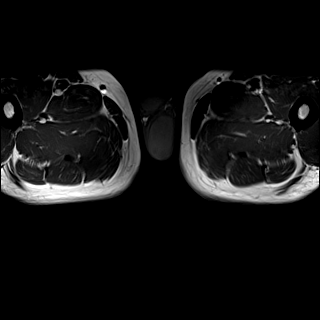

[Series 4: bSSFP fat-sat · axial · 8.0mm · 0.74mm/px · 1 of 28 slices shown]
[im 1/28]
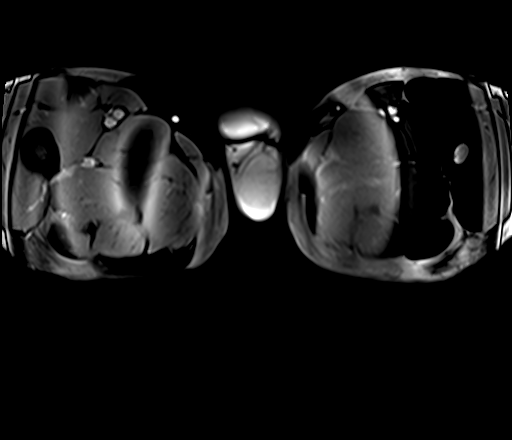

[Series 5: T2 · sagittal · 3.5mm · 0.56mm/px · 1 of 39 slices shown (1 of 4)]
[im 1/39]
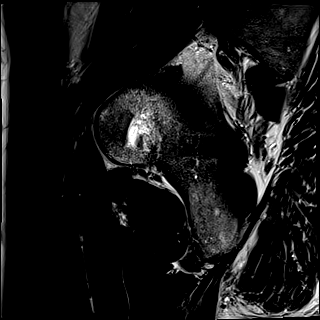

[Series 6: T1 · axial · 3.0mm · 0.31mm/px · 1 of 24 slices shown (2 of 2)]
[im 1/24]
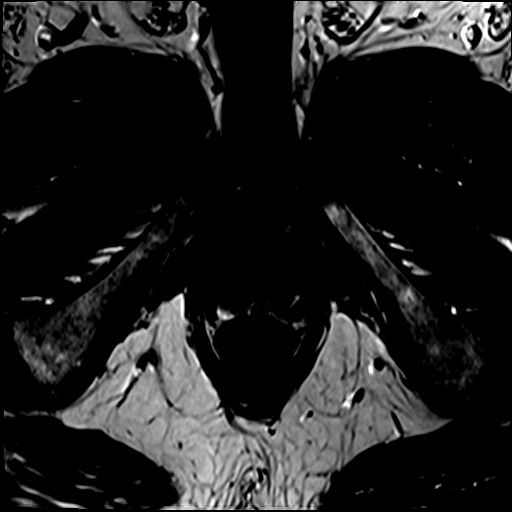

[Series 7: T2 · axial · 3.5mm · 0.56mm/px · 1 of 23 slices shown (2 of 4)]
[im 1/23]
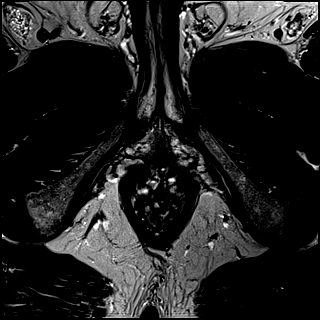

[Series 8: T2 · axial · 1.0mm · 1.04mm/px · 1 of 80 slices shown (3 of 4)]
[im 1/80]
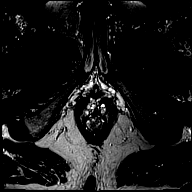

[Series 9: T2 · coronal · 3.5mm · 0.56mm/px · 1 of 23 slices shown (4 of 4)]
[im 1/23]
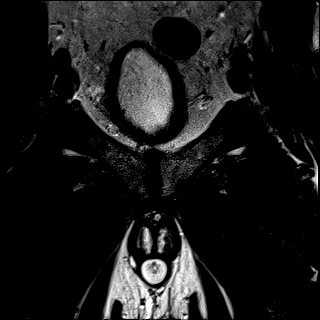

[Series 10: DWI · axial · 3.5mm · 1.56mm/px · 1 of 58 slices shown (1 of 2)]
[im 1/58]
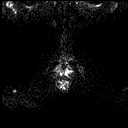

[Series 11: DWI · axial · 3.5mm · 1.56mm/px · 1 of 20 slices shown (2 of 2)]
[im 1/20]
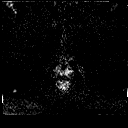

[Series 12: pre t1_twist_tra_dyn_ttc=5.3s · axial · non-contrast · 3.5mm · 0.83mm/px · 1 of 20 slices shown]
[im 1/20]
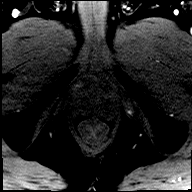

[Series 13: post t1_twist_tra_dyn-copy center · axial · 3.5mm · 0.83mm/px · 1 of 20 slices shown (1 of 24)]
[im 1/20]
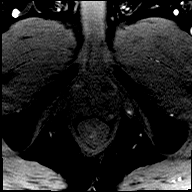

[Series 14: post t1_twist_tra_dyn-copy center · axial · 3.5mm · 0.83mm/px · 1 of 20 slices shown (2 of 24)]
[im 1/20]
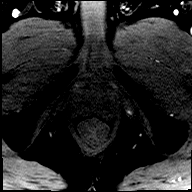

[Series 15: post t1_twist_tra_dyn-copy cent_sub_ttc=(id) · axial · 3.5mm · 0.83mm/px · 1 of 18 slices shown (1 of 22)]
[im 1/18]
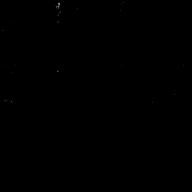

[Series 16: post t1_twist_tra_dyn-copy center · axial · 3.5mm · 0.83mm/px · 1 of 20 slices shown (3 of 24)]
[im 1/20]
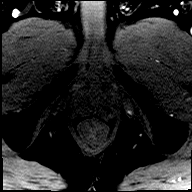

[Series 17: post t1_twist_tra_dyn-copy cent_sub_ttc=(id) · axial · 3.5mm · 0.83mm/px · 1 of 19 slices shown (2 of 22)]
[im 1/19]
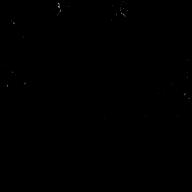

[Series 18: post t1_twist_tra_dyn-copy center · axial · 3.5mm · 0.83mm/px · 1 of 20 slices shown (4 of 24)]
[im 1/20]
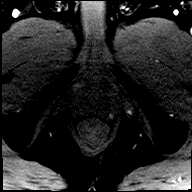

[Series 19: post t1_twist_tra_dyn-copy cent_sub_ttc=(id) · axial · 3.5mm · 0.83mm/px · 1 of 15 slices shown (3 of 22)]
[im 1/15]
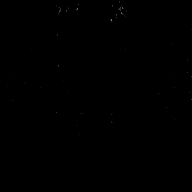

[Series 20: post t1_twist_tra_dyn-copy center · axial · 3.5mm · 0.83mm/px · 1 of 20 slices shown (5 of 24)]
[im 1/20]
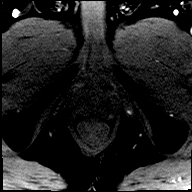

[Series 21: post t1_twist_tra_dyn-copy cent_sub_ttc=(id) · axial · 3.5mm · 0.83mm/px · 1 of 20 slices shown (4 of 22)]
[im 1/20]
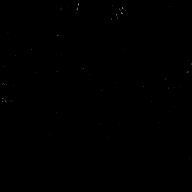

[Series 22: post t1_twist_tra_dyn-copy center · axial · 3.5mm · 0.83mm/px · 1 of 20 slices shown (6 of 24)]
[im 1/20]
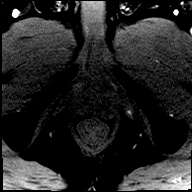

[Series 23: post t1_twist_tra_dyn-copy cent_sub_ttc=(id) · axial · 3.5mm · 0.83mm/px · 1 of 20 slices shown (5 of 22)]
[im 1/20]
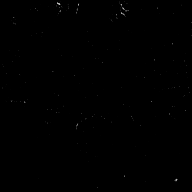

[Series 24: post t1_twist_tra_dyn-copy center · axial · 3.5mm · 0.83mm/px · 1 of 20 slices shown (7 of 24)]
[im 1/20]
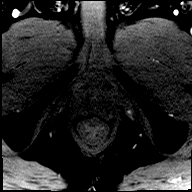

[Series 25: post t1_twist_tra_dyn-copy cent_sub_ttc=(id) · axial · 3.5mm · 0.83mm/px · 1 of 20 slices shown (6 of 22)]
[im 1/20]
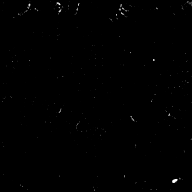

[Series 26: post t1_twist_tra_dyn-copy center · axial · 3.5mm · 0.83mm/px · 1 of 20 slices shown (8 of 24)]
[im 1/20]
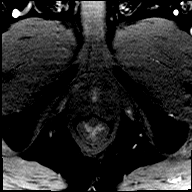

[Series 27: post t1_twist_tra_dyn-copy cent_sub_ttc=(id) · axial · 3.5mm · 0.83mm/px · 1 of 20 slices shown (7 of 22)]
[im 1/20]
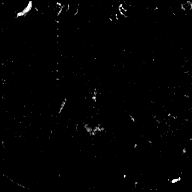

[Series 28: post t1_twist_tra_dyn-copy center · axial · 3.5mm · 0.83mm/px · 1 of 20 slices shown (9 of 24)]
[im 1/20]
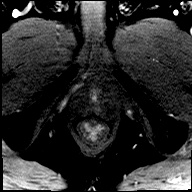

[Series 29: post t1_twist_tra_dyn-copy cent_sub_ttc=(id) · axial · 3.5mm · 0.83mm/px · 1 of 20 slices shown (8 of 22)]
[im 1/20]
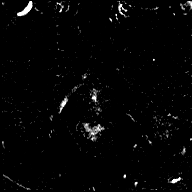

[Series 30: post t1_twist_tra_dyn-copy center · axial · 3.5mm · 0.83mm/px · 1 of 20 slices shown (10 of 24)]
[im 1/20]
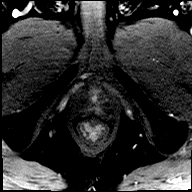

[Series 31: post t1_twist_tra_dyn-copy cent_sub_ttc=(id) · axial · 3.5mm · 0.83mm/px · 1 of 20 slices shown (9 of 22)]
[im 1/20]
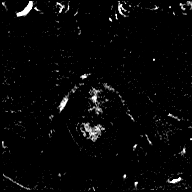

[Series 32: post t1_twist_tra_dyn-copy center · axial · 3.5mm · 0.83mm/px · 1 of 20 slices shown (11 of 24)]
[im 1/20]
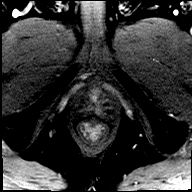

[Series 33: post t1_twist_tra_dyn-copy cent_sub_ttc=(id) · axial · 3.5mm · 0.83mm/px · 1 of 20 slices shown (10 of 22)]
[im 1/20]
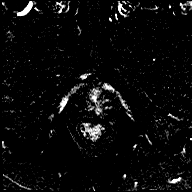

[Series 34: post t1_twist_tra_dyn-copy center · axial · 3.5mm · 0.83mm/px · 1 of 20 slices shown (12 of 24)]
[im 1/20]
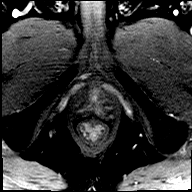

[Series 35: post t1_twist_tra_dyn-copy cent_sub_ttc=(id) · axial · 3.5mm · 0.83mm/px · 1 of 20 slices shown (11 of 22)]
[im 1/20]
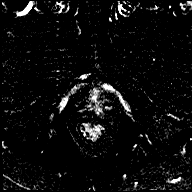

[Series 36: post t1_twist_tra_dyn-copy center · axial · 3.5mm · 0.83mm/px · 1 of 20 slices shown (13 of 24)]
[im 1/20]
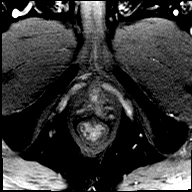

[Series 37: post t1_twist_tra_dyn-copy cent_sub_ttc=(id) · axial · 3.5mm · 0.83mm/px · 1 of 20 slices shown (12 of 22)]
[im 1/20]
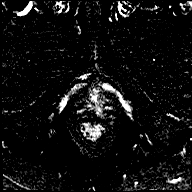

[Series 38: post t1_twist_tra_dyn-copy center · axial · 3.5mm · 0.83mm/px · 1 of 20 slices shown (14 of 24)]
[im 1/20]
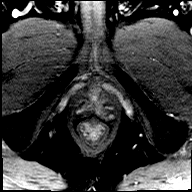

[Series 39: post t1_twist_tra_dyn-copy cent_sub_ttc=(id) · axial · 3.5mm · 0.83mm/px · 1 of 20 slices shown (13 of 22)]
[im 1/20]
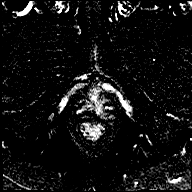

[Series 40: post t1_twist_tra_dyn-copy center · axial · 3.5mm · 0.83mm/px · 1 of 20 slices shown (15 of 24)]
[im 1/20]
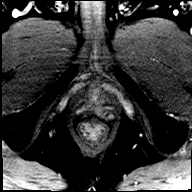

[Series 41: post t1_twist_tra_dyn-copy cent_sub_ttc=(id) · axial · 3.5mm · 0.83mm/px · 1 of 20 slices shown (14 of 22)]
[im 1/20]
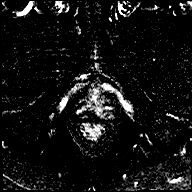

[Series 42: post t1_twist_tra_dyn-copy center · axial · 3.5mm · 0.83mm/px · 1 of 20 slices shown (16 of 24)]
[im 1/20]
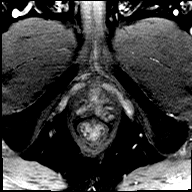

[Series 43: post t1_twist_tra_dyn-copy cent_sub_ttc=(id) · axial · 3.5mm · 0.83mm/px · 1 of 20 slices shown (15 of 22)]
[im 1/20]
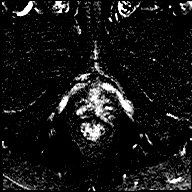

[Series 44: post t1_twist_tra_dyn-copy center · axial · 3.5mm · 0.83mm/px · 1 of 20 slices shown (17 of 24)]
[im 1/20]
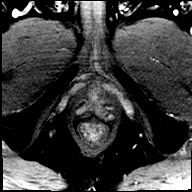

[Series 45: post t1_twist_tra_dyn-copy cent_sub_ttc=(id) · axial · 3.5mm · 0.83mm/px · 1 of 20 slices shown (16 of 22)]
[im 1/20]
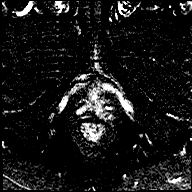

[Series 46: post t1_twist_tra_dyn-copy center · axial · 3.5mm · 0.83mm/px · 1 of 20 slices shown (18 of 24)]
[im 1/20]
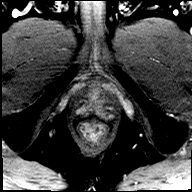

[Series 47: post t1_twist_tra_dyn-copy cent_sub_ttc=(id) · axial · 3.5mm · 0.83mm/px · 1 of 20 slices shown (17 of 22)]
[im 1/20]
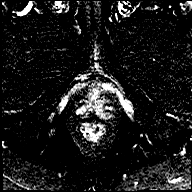

[Series 48: post t1_twist_tra_dyn-copy center · axial · 3.5mm · 0.83mm/px · 1 of 20 slices shown (19 of 24)]
[im 1/20]
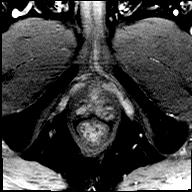

[Series 49: post t1_twist_tra_dyn-copy cent_sub_ttc=(id) · axial · 3.5mm · 0.83mm/px · 1 of 20 slices shown (18 of 22)]
[im 1/20]
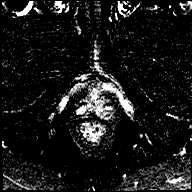

[Series 50: post t1_twist_tra_dyn-copy center · axial · 3.5mm · 0.83mm/px · 1 of 20 slices shown (20 of 24)]
[im 1/20]
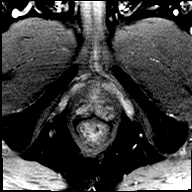

[Series 51: post t1_twist_tra_dyn-copy cent_sub_ttc=(id) · axial · 3.5mm · 0.83mm/px · 1 of 20 slices shown (19 of 22)]
[im 1/20]
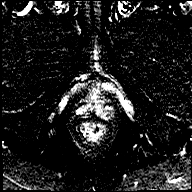

[Series 52: post t1_twist_tra_dyn-copy center · axial · 3.5mm · 0.83mm/px · 1 of 20 slices shown (21 of 24)]
[im 1/20]
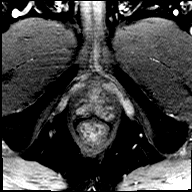

[Series 53: post t1_twist_tra_dyn-copy cent_sub_ttc=(id) · axial · 3.5mm · 0.83mm/px · 1 of 20 slices shown (20 of 22)]
[im 1/20]
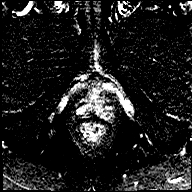

[Series 54: post t1_twist_tra_dyn-copy center · axial · 3.5mm · 0.83mm/px · 1 of 20 slices shown (22 of 24)]
[im 1/20]
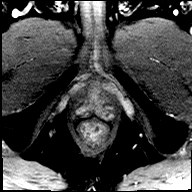

[Series 55: post t1_twist_tra_dyn-copy cent_sub_ttc=(id) · axial · 3.5mm · 0.83mm/px · 1 of 20 slices shown (21 of 22)]
[im 1/20]
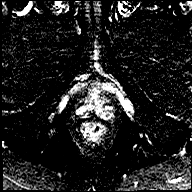

[Series 56: post t1_twist_tra_dyn-copy center · axial · 3.5mm · 0.83mm/px · 1 of 20 slices shown (23 of 24)]
[im 1/20]
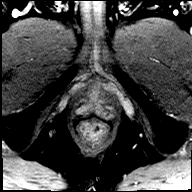

[Series 57: post t1_twist_tra_dyn-copy cent_sub_ttc=(id) · axial · 3.5mm · 0.83mm/px · 1 of 20 slices shown (22 of 22)]
[im 1/20]
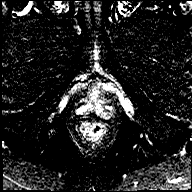

[Series 58: post t1_twist_tra_dyn-copy center · axial · 3.5mm · 0.83mm/px · 1 of 20 slices shown (24 of 24)]
[im 1/20]
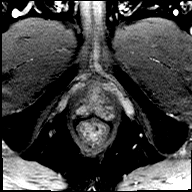

[56 of 56 positions shown; findings below may reference images not displayed]

FINDINGS: Prostate:

-- Peripheral Zone: No abnormality seen on ADC and high b-value DWI
sequences.

-- Transition/Central Zone: Circumscribed BPH nodules noted, but no
suspicious nodules with obscured or non-circumscribed margins seen.

-- Volume:  6.1 x 4.7 x 5.6 cm (volume = 84 cm^3)

Transcapsular spread:  Absent

Seminal vesicle involvement:  Absent

Neurovascular bundle involvement:  Absent

Pelvic adenopathy: None visualized

Bone metastasis: None visualized

Other:  None
IMPRESSION: No radiographic evidence of high-grade prostate carcinoma. PI-RADS
1: Very Low (clinically significant cancer is highly unlikely to be
present)

## 2019-01-23 ENCOUNTER — Other Ambulatory Visit: Payer: Self-pay | Admitting: *Deleted

## 2019-01-23 MED ORDER — TADALAFIL 5 MG PO TABS: 5.0000 mg | ORAL_TABLET | Freq: Every day | ORAL | 3 refills | Status: DC

## 2019-02-03 ENCOUNTER — Other Ambulatory Visit: Payer: Self-pay | Admitting: Cardiovascular Disease

## 2019-03-06 ENCOUNTER — Telehealth: Payer: Self-pay | Admitting: Cardiovascular Disease

## 2019-03-06 ENCOUNTER — Other Ambulatory Visit: Payer: Self-pay

## 2019-03-06 NOTE — Telephone Encounter (Signed)
Attempted to call patient. LMTCB 03/06/2019   

## 2019-03-06 NOTE — Telephone Encounter (Signed)
*  STAT* If patient is at the pharmacy, call can be transferred to refill team.   1. Which medications need to be refilled? (please list name of each medication and dose if known)  Amlodipine  2. Which pharmacy/location (including street and city if local pharmacy) is medication to be sent to? [facility] [place]  3. Do they need a 30 day or 90 day supply? [place]

## 2019-03-06 NOTE — Telephone Encounter (Signed)
Patient states that his insurance company is telling him he needs to have blood work done due to him being on Amlodipine. Please call to discuss

## 2019-03-06 NOTE — Telephone Encounter (Signed)
Spoke with patient and reviewed that he is due for follow up appointment and that those questions could be reviewed at that time. He was agreeable with this and let him know that I would send to scheduling for them to review and reach out to assist with making appointment. He verbalized understanding with no further questions at this time.

## 2019-03-07 MED ORDER — AMLODIPINE BESYLATE-VALSARTAN 5-320 MG PO TABS: ORAL_TABLET | ORAL | 0 refills | Status: DC

## 2019-03-07 NOTE — Telephone Encounter (Signed)
Please review for refill. See telephone note from Dr. Rockey Situ. Thanks!

## 2019-03-07 NOTE — Telephone Encounter (Signed)
Refill sent in for 30 days and he has scheduled follow up appointment with provider.

## 2019-03-07 NOTE — Telephone Encounter (Signed)
°*  STAT* If patient is at the pharmacy, call can be transferred to refill team.   1. Which medications need to be refilled? (please list name of each medication and dose if known) amlodipine   2. Which pharmacy/location (including street and city if local pharmacy) is medication to be sent to? [facility] [address] [place]   3. Do they need a 30 day or 90 day supply? [place]

## 2019-03-25 ENCOUNTER — Other Ambulatory Visit: Payer: Self-pay

## 2019-03-25 DIAGNOSIS — N400 Enlarged prostate without lower urinary tract symptoms: Secondary | ICD-10-CM

## 2019-03-25 DIAGNOSIS — N401 Enlarged prostate with lower urinary tract symptoms: Secondary | ICD-10-CM

## 2019-03-25 MED ORDER — TADALAFIL 5 MG PO TABS: 5.0000 mg | ORAL_TABLET | Freq: Every day | ORAL | 11 refills | Status: DC

## 2019-03-25 MED ORDER — ALFUZOSIN HCL ER 10 MG PO TB24: 10.0000 mg | ORAL_TABLET | Freq: Every day | ORAL | 11 refills | Status: DC

## 2019-04-14 ENCOUNTER — Other Ambulatory Visit: Payer: Self-pay | Admitting: Cardiovascular Disease

## 2019-04-16 ENCOUNTER — Other Ambulatory Visit: Payer: Self-pay | Admitting: Gastroenterology

## 2019-04-16 DIAGNOSIS — Z1211 Encounter for screening for malignant neoplasm of colon: Secondary | ICD-10-CM

## 2019-04-18 ENCOUNTER — Ambulatory Visit (INDEPENDENT_AMBULATORY_CARE_PROVIDER_SITE_OTHER): Payer: Managed Care, Other (non HMO) | Admitting: Urology

## 2019-04-18 ENCOUNTER — Other Ambulatory Visit: Payer: Self-pay

## 2019-04-18 ENCOUNTER — Encounter: Payer: Self-pay | Admitting: Urology

## 2019-04-18 VITALS — BP 152/72 | HR 66 | Ht 74.0 in | Wt 225.0 lb

## 2019-04-18 DIAGNOSIS — N401 Enlarged prostate with lower urinary tract symptoms: Secondary | ICD-10-CM

## 2019-04-18 MED ORDER — SILODOSIN 8 MG PO CAPS: 8.0000 mg | ORAL_CAPSULE | Freq: Every day | ORAL | 0 refills | Status: DC

## 2019-04-18 NOTE — Progress Notes (Signed)
04/18/2019 8:17 AM   Reginald Mckee [birth year] [id number]  Referring provider: No referring provider defined for this encounter.  Chief Complaint  Patient presents with  . Benign Prostatic Hypertrophy    Urologic history: 1.Elevated PSA  -PSA low to mid 4s; has elected surveillance  -MRI 12/19; no suspicious lesions; 84 cc  2.BPH with lower urinary tract symptoms   HPI: 63 y.o. male presents for semiannual follow-up.  PSA April 2020 off finasteride was 3.9.  At our last visit he requested a trial of tadalafil for his BPH.  He initially noted improvement in his symptoms however over the last 3 months does note weak urinary stream, frequency and nocturia x3.  IPSS completed today was 20/35 with a QoL 4/6.  He does note increased nasal stuffiness and thinks it may be that tadalafil.  Denies dysuria or gross hematuria.   PMH: Past Medical History:  Diagnosis Date  . Acid reflux   . Arthritis     Surgical History: Past Surgical History:  Procedure Laterality Date  . ELBOW SURGERY Right   . HERNIA REPAIR      Home Medications:  Allergies as of 04/18/2019   No Known Allergies     Medication List       Accurate as of April 18, 2019  8:17 AM. If you have any questions, ask your nurse or doctor.        alfuzosin 10 MG 24 hr tablet Commonly known as: UROXATRAL Take 1 tablet (10 mg total) by mouth daily with breakfast.   amLODipine-valsartan 5-320 MG tablet Commonly known as: EXFORGE TAKE 1 TABLET BY MOUTH DAILY   meloxicam 15 MG tablet Commonly known as: MOBIC TK 1 T PO QD PRN   methocarbamol 500 MG tablet Commonly known as: ROBAXIN Take 500 mg by mouth as needed.   omeprazole 40 MG capsule Commonly known as: PRILOSEC Take 40 mg by mouth daily.   ranitidine 150 MG tablet Commonly known as: ZANTAC Take 150 mg by mouth as needed for heartburn.   tadalafil 5 MG tablet Commonly known as: CIALIS Take 1 tablet (5 mg total) by mouth daily.       Allergies: No Known Allergies  Family History: Family History  Problem Relation Age of Onset  . Atrial fibrillation Father   . Prostate cancer Neg Hx   . Bladder Cancer Neg Hx   . Kidney cancer Neg Hx     Social History:  reports that he has never smoked. He has quit using smokeless tobacco. He reports current alcohol use. He reports that he does not use drugs.  ROS: UROLOGY Frequent Urination?: Yes Hard to postpone urination?: No Burning/pain with urination?: No Get up at night to urinate?: Yes Leakage of urine?: No Urine stream starts and stops?: No Trouble starting stream?: Yes Do you have to strain to urinate?: No Blood in urine?: No Urinary tract infection?: No Sexually transmitted disease?: No Injury to kidneys or bladder?: No Painful intercourse?: No Weak stream?: Yes Erection problems?: No Penile pain?: No  Gastrointestinal Nausea?: No Vomiting?: No Indigestion/heartburn?: No Diarrhea?: No Constipation?: No  Constitutional Fever: No Night sweats?: No Weight loss?: No Fatigue?: No  Skin Skin rash/lesions?: No Itching?: No  Eyes Blurred vision?: No Double vision?: No  Ears/Nose/Throat Sore throat?: No Sinus problems?: No  Hematologic/Lymphatic Swollen glands?: No Easy bruising?: No  Cardiovascular Leg swelling?: No Chest pain?: No  Respiratory Cough?: No Shortness of breath?: No  Endocrine Excessive thirst?: No  Musculoskeletal Back pain?:  No Joint pain?: No  Neurological Headaches?: No Dizziness?: No  Psychologic Depression?: No Anxiety?: No  Physical Exam: BP (!) 152/72   Pulse 66   Ht 6\' 2"  (1.88 m)   Wt 225 lb (102.1 kg)   BMI 28.89 kg/m   Constitutional:  Alert and oriented, No acute distress. HEENT: [place] AT, moist mucus membranes.  Trachea midline, no masses. Cardiovascular: No clubbing, cyanosis, or edema. Respiratory: Normal respiratory effort, no increased work of breathing. Neurologic: Grossly intact,  no focal deficits, moving all 4 extremities. Psychiatric: Normal mood and affect.   Assessment & Plan:    - BPH with lower urinary tract symptoms I discussed increasing his alfuzosin however he may have more nasal stuffiness with a dose increase.  Will change to silodosin to see if he gets any greater efficacy and less side effects.  Continue tadalafil for now.  I also discussed surgical options including UroLift, TURP and HOLEP.  He is not interested in pursuing surgical management at this time.  Follow-up 6 months for PSA, DRE and symptom reassessment.  Earlier for worsening voiding symptoms.  Greater than 50% of this 15-minute visit was spent counseling the patient.   [name], [place] [address] [place], [place] [address] [phone]

## 2019-04-21 ENCOUNTER — Other Ambulatory Visit: Payer: Self-pay

## 2019-04-21 ENCOUNTER — Encounter: Payer: Self-pay | Admitting: Cardiovascular Disease

## 2019-04-21 ENCOUNTER — Ambulatory Visit (INDEPENDENT_AMBULATORY_CARE_PROVIDER_SITE_OTHER): Payer: Managed Care, Other (non HMO) | Admitting: Cardiovascular Disease

## 2019-04-21 VITALS — BP 160/62 | HR 68 | Ht 73.0 in | Wt 231.5 lb

## 2019-04-21 DIAGNOSIS — K219 Gastro-esophageal reflux disease without esophagitis: Secondary | ICD-10-CM | POA: Diagnosis not present

## 2019-04-21 DIAGNOSIS — R9431 Abnormal electrocardiogram [ECG] [EKG]: Secondary | ICD-10-CM

## 2019-04-21 DIAGNOSIS — I10 Essential (primary) hypertension: Secondary | ICD-10-CM

## 2019-04-21 MED ORDER — AMLODIPINE BESYLATE-VALSARTAN 10-320 MG PO TABS: 1.0000 | ORAL_TABLET | Freq: Every day | ORAL | 3 refills | Status: DC

## 2019-04-21 NOTE — Progress Notes (Signed)
Cardiology Office Note  Date:  04/21/2019   ID:  Reginald Mckee, DOB Nov 28, 1955, MRN [id number]  PCP:  Patient, No Pcp Per  [facility]rology:[facility]  Chief Complaint  Patient presents with  . office visit    Discuss meds and lab results    HPI:  [name] Is a 63 year old gentleman with past medical history of BPH Elevated PSA Hypertension Remote social smoker Chronic back problem GERD History of leg swelling Presents for f/u of his hypertension,   [id number] systolic pressure recently Took some time to get used to medication amlodipine-valsartan Rare episodes of orthostasis in the heat Could have been heatstroke  Very active at baseline Works on [place] acres  Pressures before starting blood pressure medication [id number] up to A999333 systolic Previously reported palpitations No regular exercise program  Total cholesterol less than 150 on no medication No recent lab work this year No known diabetes  EKG personally reviewed by myself on todays visit Shows normal sinus rhythm rate 68 bpm nonspecific T wave abnormality V5 V6 No change from prior EKG   PMH:   has a past medical history of Acid reflux and Arthritis.  PSH:    Past Surgical History:  Procedure Laterality Date  . ELBOW SURGERY Right   . HERNIA REPAIR      Current Outpatient Medications  Medication Sig Dispense Refill  . amLODipine-valsartan (EXFORGE) 5-320 MG tablet TAKE 1 TABLET BY MOUTH DAILY 30 tablet 0  . meloxicam (MOBIC) 15 MG tablet TK 1 T PO QD PRN  5  . methocarbamol (ROBAXIN) 500 MG tablet Take 500 mg by mouth as needed.     Marland Kitchen omeprazole (PRILOSEC) 40 MG capsule Take 40 mg by mouth daily.    . silodosin (RAPAFLO) 8 MG CAPS capsule Take 1 capsule (8 mg total) by mouth daily with breakfast. 30 capsule 0  . tadalafil (CIALIS) 5 MG tablet Take 1 tablet (5 mg total) by mouth daily. 30 tablet 11  . ranitidine (ZANTAC) 150 MG tablet Take 150 mg by mouth as needed for heartburn.     No current  facility-administered medications for this visit.      Allergies:   Patient has no known allergies.   Social History:  The patient  reports that he has never smoked. He has quit using smokeless tobacco. He reports current alcohol use. He reports that he does not use drugs.   Family History:   family history includes Atrial fibrillation in his father.    Review of Systems: Review of Systems  Constitutional: Negative.   Respiratory: Negative.   Cardiovascular: Negative.   Gastrointestinal: Negative.   Musculoskeletal: Positive for back pain.  Neurological: Negative.   Psychiatric/Behavioral: Negative.   All other systems reviewed and are negative.    PHYSICAL EXAM: VS:  BP (!) 160/62 (BP Location: Left Arm, Patient Position: Sitting, Cuff Size: Normal)   Pulse 68   Ht 6\' 1"  (1.854 m)   Wt 231 lb 8 oz (105 kg)   SpO2 97%   BMI 30.54 kg/m  , BMI Body mass index is 30.54 kg/m. GEN: Well nourished, well developed, in no acute distress  HEENT: normal  Neck: no JVD, carotid bruits, or masses Cardiac: RRR; no murmurs, rubs, or gallops,no edema  Respiratory:  clear to auscultation bilaterally, normal work of breathing GI: soft, nontender, nondistended, + BS MS: no deformity or atrophy  Skin: warm and dry, no rash Neuro:  Strength and sensation are intact Psych: euthymic mood, full affect  Recent Labs: No results found for requested labs within last 8760 hours.    Lipid Panel Lab Results  Component Value Date   CHOL 148 01/11/2018   HDL 56 01/11/2018   LDLCALC 82 01/11/2018   TRIG 51 01/11/2018      Wt Readings from Last 3 Encounters:  04/21/19 231 lb 8 oz (105 kg)  04/18/19 225 lb (102.1 kg)  10/29/18 225 lb (102.1 kg)       ASSESSMENT AND PLAN:  Abnormal EKG Nonspecific ST and T wave abnormality No change on EKG compared to your prior Discussed anginal symptoms to watch for Few risk factors  Malignant hypertension - Plan: EKG 12-Lead Improved  numbers on the amlodipine valsartan  Still elevated, recommended he increase the medication up to 10/320 We will avoid HCTZ given 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    Signed, Reginald Mckee, M.D., Ph.D. 04/21/2019  [place], [place]

## 2019-04-21 NOTE — Patient Instructions (Signed)
Medication Instructions:  Please increase the amlodipine/valsartan up to 10/320 mg daily  (ok to cut in 1/2 twice a day or take in the Pm) Monitor blood pressure  If you need a refill on your cardiac medications before your next appointment, please call your pharmacy.    Lab work: No new labs needed   If you have labs (blood work) drawn today and your tests are completely normal, you will receive your results only by: Marland Kitchen MyChart Message (if you have MyChart) OR . A paper copy in the mail If you have any lab test that is abnormal or we need to change your treatment, we will call you to review the results.   Testing/Procedures: No new testing needed   Follow-Up: At [facility], you and your health needs are our priority.  As part of our continuing mission to provide you with exceptional heart care, we have created designated Provider Care Teams.  These Care Teams include your primary Cardiologist (physician) and Advanced Practice Providers (APPs -  Physician Assistants and Nurse Practitioners) who all work together to provide you with the care you need, when you need it.  . You will need a follow up appointment in 12 months .   Please call our office 2 months in advance to schedule this appointment.    . Providers on your designated Care Team:   . Murray Hodgkins, NP . Christell Faith, PA-C . Marrianne Mood, PA-C  Any Other Special Instructions Will Be Listed Below (If Applicable).  For educational health videos Log in to : [email or web address] Or : [email or web address], password : [email or web address]

## 2019-04-25 ENCOUNTER — Inpatient Hospital Stay: Admission: RE | Admit: 2019-04-25 | Payer: Managed Care, Other (non HMO) | Source: Ambulatory Visit

## 2019-06-14 ENCOUNTER — Encounter: Payer: Self-pay | Admitting: Urology

## 2019-07-24 ENCOUNTER — Encounter: Payer: Self-pay | Admitting: Urology

## 2019-07-24 ENCOUNTER — Other Ambulatory Visit: Payer: Self-pay

## 2019-07-24 ENCOUNTER — Ambulatory Visit (INDEPENDENT_AMBULATORY_CARE_PROVIDER_SITE_OTHER): Payer: Managed Care, Other (non HMO) | Admitting: Urology

## 2019-07-24 VITALS — BP 173/65 | HR 71 | Ht 72.0 in | Wt 225.0 lb

## 2019-07-24 DIAGNOSIS — N401 Enlarged prostate with lower urinary tract symptoms: Secondary | ICD-10-CM | POA: Diagnosis not present

## 2019-07-24 DIAGNOSIS — R972 Elevated prostate specific antigen [PSA]: Secondary | ICD-10-CM | POA: Diagnosis not present

## 2019-07-24 NOTE — Progress Notes (Signed)
07/24/2019 5:43 PM   Reginald Mckee [birth year] [id number]  Referring provider: No referring provider defined for this encounter.  Chief Complaint  Patient presents with  . Follow-up    Urologic history: 1.Elevated PSA -PSA low to mid 4s; has elected surveillance -MRI 12/19; 84 cc gland; no indeterminant/suspicious lesions  2.BPH with lower urinary tract symptoms   HPI: 64 y.o. male last seen April 2020.  He had been on silodosin and tadalafil was added.  He has previously been on alfuzosin and finasteride.  He stopped all of his prostate medications in December 2020 due to nasal stuffiness/congestion which was becoming intolerable.  He has not really noticed any worsening of his lower urinary tract symptoms since that time.  He made a follow-up appointment to discuss surgical options.  He has not had a cystoscopy.  Denies dysuria, gross hematuria.   PMH: Past Medical History:  Diagnosis Date  . Acid reflux   . Arthritis     Surgical History: Past Surgical History:  Procedure Laterality Date  . ELBOW SURGERY Right   . HERNIA REPAIR      Home Medications:  Allergies as of 07/24/2019   No Known Allergies     Medication List       Accurate as of July 24, 2019  5:43 PM. If you have any questions, ask your nurse or doctor.        STOP taking these medications   silodosin 8 MG Caps capsule Commonly known as: RAPAFLO Stopped by: [name], MD   tadalafil 5 MG tablet Commonly known as: CIALIS Stopped by: [name], MD     TAKE these medications   amLODipine-valsartan 10-320 MG tablet Commonly known as: EXFORGE Take 1 tablet by mouth daily.   meloxicam 15 MG tablet Commonly known as: MOBIC TK 1 T PO QD PRN   methocarbamol 500 MG tablet Commonly known as: ROBAXIN Take 500 mg by mouth as needed.   omeprazole 40 MG capsule Commonly known as: PRILOSEC Take 40 mg by mouth daily.   ranitidine 150 MG tablet Commonly known as:  ZANTAC Take 150 mg by mouth as needed for heartburn.       Allergies: No Known Allergies  Family History: Family History  Problem Relation Age of Onset  . Atrial fibrillation Father   . Prostate cancer Neg Hx   . Bladder Cancer Neg Hx   . Kidney cancer Neg Hx     Social History:  reports that he has never smoked. He has quit using smokeless tobacco. He reports current alcohol use. He reports that he does not use drugs.  ROS: UROLOGY Frequent Urination?: No Hard to postpone urination?: No Burning/pain with urination?: No Get up at night to urinate?: No Leakage of urine?: No Urine stream starts and stops?: No Trouble starting stream?: No Do you have to strain to urinate?: No Blood in urine?: No Urinary tract infection?: No Sexually transmitted disease?: No Injury to kidneys or bladder?: No Painful intercourse?: No Weak stream?: No Erection problems?: No Penile pain?: No  Gastrointestinal Nausea?: No Vomiting?: No Indigestion/heartburn?: No Diarrhea?: No Constipation?: No  Constitutional Fever: No Night sweats?: No Weight loss?: No Fatigue?: No  Skin Skin rash/lesions?: No Itching?: No  Eyes Blurred vision?: No Double vision?: No  Ears/Nose/Throat Sore throat?: No Sinus problems?: No  Hematologic/Lymphatic Swollen glands?: No Easy bruising?: No  Cardiovascular Leg swelling?: No Chest pain?: No  Respiratory Cough?: No Shortness of breath?: No  Endocrine Excessive thirst?: No  Musculoskeletal Back pain?: No Joint pain?: No  Neurological Headaches?: No Dizziness?: No  Psychologic Depression?: No Anxiety?: No  Physical Exam: BP (!) 173/65   Pulse 71   Ht 6' (1.829 m)   Wt 225 lb (102.1 kg)   BMI 30.52 kg/m   Constitutional:  Alert and oriented, No acute distress. HEENT: [place] AT, moist mucus membranes.  Trachea midline, no masses. Cardiovascular: No clubbing, cyanosis, or edema. Respiratory: Normal respiratory effort, no  increased work of breathing. Neurologic: Grossly intact, no focal deficits, moving all 4 extremities. Psychiatric: Normal mood and affect.   Assessment & Plan:    - BPH with lower urinary tract symptoms He has discontinued his BPH medications secondary to side effects and has not seen a worsening of his voiding pattern.  Feel his most successful outlet procedure would be Holep based on prostate size.  It typically is a same-day procedure with indwelling catheter for 3 days.  Potential side effects were discussed.  We also discussed minimally invasive options of UroLift and Rezum.  His prostate size is borderline for UroLift and he would need cystoscopy to rule out an obstructive median lobe.  Due to COVID-19 pandemic UroLift would not be able until minimally elective cases can be performed.  He was informed that we do not perform Rezum however both of these procedures are performed in the office at [facility] in [place].  He would like to think over these options and indicated he would call back with his decision.  - Elevated PSA Mild PSA elevation with negative MRI.  He will be due for a follow-up PSA April 2021.   [name], [place] [address], [place], [place] [address] [phone]

## 2019-07-28 ENCOUNTER — Telehealth: Payer: Self-pay | Admitting: Urology

## 2019-07-28 NOTE — Telephone Encounter (Signed)
App made and mailed °

## 2019-07-28 NOTE — Telephone Encounter (Signed)
-----   Message from [name], MD sent at 07/24/2019  5:49 PM EST ----- Regarding: Lab visit Needs lab visit for a follow-up PSA April 2020

## 2019-10-16 ENCOUNTER — Other Ambulatory Visit: Payer: Self-pay

## 2019-10-16 DIAGNOSIS — R972 Elevated prostate specific antigen [PSA]: Secondary | ICD-10-CM

## 2019-10-17 ENCOUNTER — Other Ambulatory Visit: Payer: Managed Care, Other (non HMO)

## 2019-10-17 ENCOUNTER — Other Ambulatory Visit: Payer: Self-pay

## 2019-10-17 ENCOUNTER — Ambulatory Visit: Payer: Managed Care, Other (non HMO) | Admitting: Urology

## 2019-10-17 DIAGNOSIS — R972 Elevated prostate specific antigen [PSA]: Secondary | ICD-10-CM

## 2019-10-18 LAB — PSA: Prostate Specific Ag, Serum: 6.3 ng/mL — ABNORMAL HIGH (ref 0.0–4.0)

## 2019-10-18 NOTE — Progress Notes (Signed)
10/20/19 9:38 AM   Reginald Mckee 01/02/1956 [id number]  Referring provider: No referring provider defined for this encounter.  Chief Complaint  Patient presents with  . Benign Prostatic Hypertrophy   Urologic history: 1.Elevated PSA -PSA low to mid 4s; has elected surveillance -MRI 12/19; 84 cc gland; no indeterminant/suspicious lesions  2.BPH with lower urinary tract symptoms  HPI: Reginald Mckee is a 64 y.o. M who returns today for a 6 month f/u for above hx.   -Discontinued BPH meds secondary to side effects in 06/2019 -No worsening urinary symptoms since -Nocturia x1  -BPH meds not effective enough to reconsider use -PSA 6.3 as of 10/17/19  PMH: Past Medical History:  Diagnosis Date  . Acid reflux   . Arthritis     Surgical History: Past Surgical History:  Procedure Laterality Date  . ELBOW SURGERY Right   . HERNIA REPAIR      Home Medications:  Allergies as of 10/20/2019   No Known Allergies     Medication List       Accurate as of October 20, 2019  9:38 AM. If you have any questions, ask your nurse or doctor.        amLODipine-valsartan 10-320 MG tablet Commonly known as: EXFORGE Take 1 tablet by mouth daily.   meloxicam 15 MG tablet Commonly known as: MOBIC TK 1 T PO QD PRN   methocarbamol 500 MG tablet Commonly known as: ROBAXIN Take 500 mg by mouth as needed.   omeprazole 40 MG capsule Commonly known as: PRILOSEC Take 40 mg by mouth daily.   ranitidine 150 MG tablet Commonly known as: ZANTAC Take 150 mg by mouth as needed for heartburn.       Allergies: No Known Allergies  Family History: Family History  Problem Relation Age of Onset  . Atrial fibrillation Father   . Prostate cancer Neg Hx   . Bladder Cancer Neg Hx   . Kidney cancer Neg Hx     Social History:  reports that he has never smoked. He has quit using smokeless tobacco. He reports current alcohol use. He reports that he does not use  drugs.   Physical Exam: BP (!) 168/77   Pulse 73   Ht 6\' 3"  (1.905 m)   Wt 235 lb (106.6 kg)   BMI 29.37 kg/m   Constitutional:  Alert and oriented, No acute distress. HEENT: [place] AT, moist mucus membranes.  Trachea midline, no masses. Cardiovascular: No clubbing, cyanosis, or edema. Respiratory: Normal respiratory effort, no increased work of breathing. Rectal: Declined DRE  Skin: No rashes, bruises or suspicious lesions. Neurologic: Grossly intact, no focal deficits, moving all 4 extremities. Psychiatric: Normal mood and affect.  Assessment & Plan:    1. BPH w/ LUTS  LUTS not bothersome enough to consider BPH meds given side effects and ineffectiveness  Discussed surgical options and feel most successful outlet procedure would be Holep based on prostate size.  2. Elevated PSA  PSA elevated to 6.3 from previous PSA of 3.9 from 10/2018. Was 6.4 and 2018 and patient states it has been as high as 8 Declined DRE today  Discussed false-negative rates of MRI and option of surveillance versus standard prostate biopsy Pt elected continued surveillance Return with PSA prior in 6 months    [place] [address], [place], [place] [address] [phone]  I, Reginald Mckee, am acting as a scribe for Dr. Nicki Reaper C. Arthelia Mckee,  I have reviewed the above documentation for  accuracy and completeness, and I agree with the above.    Reginald Sons, MD

## 2019-10-20 ENCOUNTER — Other Ambulatory Visit: Payer: Self-pay

## 2019-10-20 ENCOUNTER — Encounter: Payer: Self-pay | Admitting: Urology

## 2019-10-20 ENCOUNTER — Ambulatory Visit (INDEPENDENT_AMBULATORY_CARE_PROVIDER_SITE_OTHER): Payer: Managed Care, Other (non HMO) | Admitting: Urology

## 2019-10-20 VITALS — BP 168/77 | HR 73 | Ht 75.0 in | Wt 235.0 lb

## 2019-10-20 DIAGNOSIS — N401 Enlarged prostate with lower urinary tract symptoms: Secondary | ICD-10-CM

## 2019-10-20 DIAGNOSIS — R972 Elevated prostate specific antigen [PSA]: Secondary | ICD-10-CM | POA: Diagnosis not present

## 2020-04-14 ENCOUNTER — Other Ambulatory Visit: Payer: Self-pay | Admitting: Family Medicine

## 2020-04-14 DIAGNOSIS — R972 Elevated prostate specific antigen [PSA]: Secondary | ICD-10-CM

## 2020-04-16 ENCOUNTER — Other Ambulatory Visit: Payer: Managed Care, Other (non HMO)

## 2020-04-16 ENCOUNTER — Other Ambulatory Visit: Payer: Self-pay

## 2020-04-16 DIAGNOSIS — R972 Elevated prostate specific antigen [PSA]: Secondary | ICD-10-CM

## 2020-04-17 LAB — PSA: Prostate Specific Ag, Serum: 5.3 ng/mL — ABNORMAL HIGH (ref 0.0–4.0)

## 2020-04-19 ENCOUNTER — Telehealth: Payer: Self-pay

## 2020-04-19 DIAGNOSIS — R972 Elevated prostate specific antigen [PSA]: Secondary | ICD-10-CM

## 2020-04-19 NOTE — Telephone Encounter (Signed)
Called pt informed him of the information below. Pt gave verbal understanding. Appts scheduled. Lab ordered.  

## 2020-04-19 NOTE — Telephone Encounter (Signed)
-----   Message from [name], MD sent at 04/18/2020  1:27 PM EDT ----- PSA stable 5.3.  Schedule follow-up visit April 2022 for DRE with PSA prior

## 2020-04-28 ENCOUNTER — Other Ambulatory Visit: Payer: Self-pay | Admitting: Cardiovascular Disease

## 2020-07-06 ENCOUNTER — Other Ambulatory Visit: Payer: Self-pay | Admitting: Cardiovascular Disease

## 2020-07-06 NOTE — Telephone Encounter (Signed)
LVM for patient to schedule.

## 2020-07-06 NOTE — Telephone Encounter (Signed)
Please schedule overdue F/U appointment. Thank you! ?

## 2020-07-07 ENCOUNTER — Other Ambulatory Visit: Payer: Self-pay

## 2020-07-07 MED ORDER — AMLODIPINE BESYLATE-VALSARTAN 10-320 MG PO TABS: 1.0000 | ORAL_TABLET | Freq: Every day | ORAL | 0 refills | Status: DC

## 2020-07-07 NOTE — Telephone Encounter (Signed)
Scheduled for 2/8, needs a month supply

## 2020-08-07 NOTE — Progress Notes (Unsigned)
Cardiology Office Note  Date:  08/10/2020   ID:  Reginald Mckee, DOB Jul 10, 1955, MRN [id number]  PCP:  Patient, No Pcp Per  Urology:Brian Pilar Jarvis  Chief Complaint  Patient presents with  . Follow-up    Annual follow up. Medications verbally reviewed with patient.    HPI:  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, Surveyor, minerals a garden, clearing trees Works locally Has some mild SOB, thinks it is conditioning  Has been out of his medications for several weeks, blood pressure running high Concerned about low testosterone  Followed by urology, labile PSA Has significant issues with urination, reluctant to undergo any procedure Interested in prostate ablation therapy, embolization of the vessel  Denies any orthostasis symptoms No chest pain concerning for angina  Previous total cholesterol 150  EKG personally reviewed by myself on todays visit Shows normal sinus rhythm rate 60 bpm nonspecific T wave abnormality V5,  V6, 1 and aVL No changes   PMH:   has a past medical history of Acid reflux and Arthritis.  PSH:    Past Surgical History:  Procedure Laterality Date  . ELBOW SURGERY Right   . HERNIA REPAIR      Current Outpatient Medications  Medication Sig Dispense Refill  . amLODipine-valsartan (EXFORGE) 10-320 MG tablet Take 1 tablet by mouth daily. Please schedule office visit for further refills-2nd request. Thank you! 30 tablet 0   No current facility-administered medications for this visit.     Allergies:   Patient has no known allergies.   Social History:  The patient  reports that he has never smoked. He has quit using smokeless tobacco. He reports current alcohol use. He reports that he does not use drugs.   Family  History:   family history includes Atrial fibrillation in his father.    Review of Systems: Review of Systems  Constitutional: Negative.   Respiratory: Negative.   Cardiovascular: Negative.   Gastrointestinal: Negative.   Musculoskeletal: Positive for back pain.  Neurological: Negative.   Psychiatric/Behavioral: Negative.   All other systems reviewed and are negative.    PHYSICAL EXAM: VS:  BP (!) 164/70 (BP Location: Left Arm, Patient Position: Sitting, Cuff Size: Normal)   Pulse 60   Ht 6\' 1"  (1.854 m)   Wt 231 lb (104.8 kg)   SpO2 98%   BMI 30.48 kg/m  , BMI Body mass index is 30.48 kg/m. GEN: Well nourished, well developed, in no acute distress  HEENT: normal  Neck: no JVD, carotid bruits, or masses Cardiac: RRR; no murmurs, rubs, or gallops,no edema  Respiratory:  clear to auscultation bilaterally, normal work of breathing GI: soft, nontender, nondistended, + BS MS: no deformity or atrophy  Skin: warm and dry, no rash Neuro:  Strength and sensation are intact Psych: euthymic mood, full affect    Recent Labs: No results found for requested labs within last 8760 hours.    Lipid Panel Lab Results  Component Value Date   CHOL 148 01/11/2018   HDL 56 01/11/2018   LDLCALC 82 01/11/2018   TRIG 51 01/11/2018      Wt Readings from Last 3 Encounters:  08/10/20 231 lb (104.8 kg)  10/20/19 235 lb (106.6 kg)  07/24/19 225 lb (102.1 kg)       ASSESSMENT AND  PLAN:  Abnormal EKG Nonspecific ST and T wave abnormality Denies anginal symptoms  Malignant hypertension - Plan: EKG 12-Lead We have refilled the amlodipine valsartan 10/320 mg daily Recommend he research Cardura/doxazosin if blood pressure continues to run high.  This could also be with assistance with his prostate issues  Benign prostatic hyperplasia, unspecified whether lower urinary tract symptoms present Managed by urology Did not want to stay on [place] He is requesting testosterone level,  labs ordered  GERD Reports symptoms stable   Total encounter time more than 25 minutes  Greater than 50% was spent in counseling and coordination of care with the patient    No orders of the defined types were placed in this encounter.    Signed, Esmond Plants, M.D., Ph.D. 08/10/2020  [place], [place]

## 2020-08-10 ENCOUNTER — Ambulatory Visit (INDEPENDENT_AMBULATORY_CARE_PROVIDER_SITE_OTHER): Payer: Managed Care, Other (non HMO) | Admitting: Cardiovascular Disease

## 2020-08-10 ENCOUNTER — Other Ambulatory Visit: Payer: Self-pay

## 2020-08-10 VITALS — BP 164/70 | HR 60 | Ht 73.0 in | Wt 231.0 lb

## 2020-08-10 DIAGNOSIS — K219 Gastro-esophageal reflux disease without esophagitis: Secondary | ICD-10-CM

## 2020-08-10 DIAGNOSIS — I10 Essential (primary) hypertension: Secondary | ICD-10-CM

## 2020-08-10 DIAGNOSIS — N5201 Erectile dysfunction due to arterial insufficiency: Secondary | ICD-10-CM

## 2020-08-10 DIAGNOSIS — N401 Enlarged prostate with lower urinary tract symptoms: Secondary | ICD-10-CM | POA: Diagnosis not present

## 2020-08-10 MED ORDER — AMLODIPINE BESYLATE-VALSARTAN 10-320 MG PO TABS: 1.0000 | ORAL_TABLET | Freq: Every day | ORAL | 4 refills | Status: DC

## 2020-08-10 NOTE — Patient Instructions (Addendum)
Research cardura/doxazosin for prostate and blood pressure  Medication Instructions:  No changes   Lab work: CMP, CBC, Free testosterone , Lipids  Walk into medical mall at the check in desk, they will direct you to lab registration, hours for labs are Monday-Friday 07:00am-5:30pm (no appointment necessary) (You will see you results in your MyChart the same time the doctor receives the results, they need time to review results and make recommendations, you can expect a call a few days after your test)  Testing/Procedures: No new testing needed   Follow-Up:  . You will need a follow up appointment in 12 months  . Providers on your designated Care Team:   . Murray Hodgkins, NP . Christell Faith, PA-C . Marrianne Mood, PA-C  Any Other Special Instructions Will Be Listed Below (If Applicable).  COVID-19 Vaccine Information can be found at: [email or web address].uk For questions related to vaccine distribution or appointments, please email [email or web address] or call [phone].

## 2020-08-30 ENCOUNTER — Other Ambulatory Visit
Admission: RE | Admit: 2020-08-30 | Discharge: 2020-08-30 | Disposition: A | Discharge status: Home / Self Care | Payer: Managed Care, Other (non HMO) | Source: Ambulatory Visit | Attending: Cardiovascular Disease | Admitting: Cardiovascular Disease

## 2020-08-30 DIAGNOSIS — I10 Essential (primary) hypertension: Secondary | ICD-10-CM | POA: Insufficient documentation

## 2020-08-30 DIAGNOSIS — N5201 Erectile dysfunction due to arterial insufficiency: Secondary | ICD-10-CM

## 2020-08-30 DIAGNOSIS — N401 Enlarged prostate with lower urinary tract symptoms: Secondary | ICD-10-CM

## 2020-08-30 LAB — LIPID PANEL
Cholesterol: 168 mg/dL (ref 0–200)
HDL: 64 mg/dL (ref >40)
LDL Cholesterol: 86 mg/dL (ref 0–99)
Total CHOL/HDL Ratio: 2.6 RATIO
Triglycerides: 88 mg/dL (ref <150)
VLDL: 18 mg/dL (ref 0–40)

## 2020-08-30 LAB — CBC
HCT: 45.1 % (ref 39.0–52.0)
Hemoglobin: 15.3 g/dL (ref 13.0–17.0)
MCH: 30.2 pg (ref 26.0–34.0)
MCHC: 33.9 g/dL (ref 30.0–36.0)
MCV: 89 fL (ref 80.0–100.0)
Platelets: 181 10*3/uL (ref 150–400)
RBC: 5.07 MIL/uL (ref 4.22–5.81)
RDW: 12.6 % (ref 11.5–15.5)
WBC: 3.8 10*3/uL — ABNORMAL LOW (ref 4.0–10.5)
nRBC: 0 % (ref 0.0–0.2)

## 2020-08-30 LAB — COMPREHENSIVE METABOLIC PANEL
ALT: 17 U/L (ref 0–44)
AST: 19 U/L (ref 15–41)
Albumin: 4.1 g/dL (ref 3.5–5.0)
Alkaline Phosphatase: 38 U/L (ref 38–126)
Anion gap: 9 (ref 5–15)
BUN: 18 mg/dL (ref 8–23)
CO2: 25 mmol/L (ref 22–32)
Calcium: 9.1 mg/dL (ref 8.9–10.3)
Chloride: 104 mmol/L (ref 98–111)
Creatinine, Ser: 0.91 mg/dL (ref 0.61–1.24)
GFR, Estimated: >60 mL/min (ref >60)
Glucose, Bld: 107 mg/dL — ABNORMAL HIGH (ref 70–99)
Potassium: 4.1 mmol/L (ref 3.5–5.1)
Sodium: 138 mmol/L (ref 135–145)
Total Bilirubin: 1.2 mg/dL (ref 0.3–1.2)
Total Protein: 6.6 g/dL (ref 6.5–8.1)

## 2020-09-02 ENCOUNTER — Other Ambulatory Visit
Admission: RE | Admit: 2020-09-02 | Discharge: 2020-09-02 | Disposition: A | Discharge status: Home / Self Care | Payer: Managed Care, Other (non HMO) | Source: Ambulatory Visit | Attending: Cardiovascular Disease | Admitting: Cardiovascular Disease

## 2020-09-02 ENCOUNTER — Other Ambulatory Visit: Payer: Self-pay | Admitting: *Deleted

## 2020-09-02 DIAGNOSIS — N5201 Erectile dysfunction due to arterial insufficiency: Secondary | ICD-10-CM | POA: Insufficient documentation

## 2020-09-02 DIAGNOSIS — N401 Enlarged prostate with lower urinary tract symptoms: Secondary | ICD-10-CM | POA: Insufficient documentation

## 2020-09-03 LAB — TESTOSTERONE: Testosterone: 363 ng/dL (ref 264–916)

## 2020-10-11 ENCOUNTER — Other Ambulatory Visit: Payer: Managed Care, Other (non HMO)

## 2020-10-11 ENCOUNTER — Other Ambulatory Visit: Payer: Self-pay

## 2020-10-11 DIAGNOSIS — R972 Elevated prostate specific antigen [PSA]: Secondary | ICD-10-CM

## 2020-10-12 LAB — PSA: Prostate Specific Ag, Serum: 6.3 ng/mL — ABNORMAL HIGH (ref 0.0–4.0)

## 2020-10-17 NOTE — Progress Notes (Signed)
10/18/2020  10:28 AM   Reginald Mckee [birth year] [id number]  Referring provider: No referring provider defined for this encounter. Chief Complaint  Patient presents with  . Elevated PSA     Urologic history: 1.Elevated PSA -PSA low to mid 4s; has elected surveillance -MRI 12/19; 84 cc gland; no indeterminant/suspicious lesions  2.BPH with lower urinary tract symptoms  HPI: Reginald Mckee is a 65 y.o. male for annual follow up of BPH and an elevated PSA  - Moderate LUTS which have not significant change since last visit. - Prior IPS 16/35 with frequency, weak urinary stream, intermittent stream and nocturia x3 - Prior medical management without significant efficacy - Surgical options have been discussed previously - He has been reading on PAE and may be interested in pursuing  PSA Trend: - 07/27/2017: 4.8 - 11/02/2017: 4.1 - 04/29/2018: 4.3 - 10/17/2018: 3.9 - 10/17/2019: 6.3 - 04/16/2020: 5.3 - 10/11/2020: 6.3   PMH: Past Medical History:  Diagnosis Date  . Acid reflux   . Arthritis     Surgical History: Past Surgical History:  Procedure Laterality Date  . ELBOW SURGERY Right   . HERNIA REPAIR      Home Medications:  Allergies as of 10/18/2020   No Known Allergies     Medication List       Accurate as of October 18, 2020 10:28 AM. If you have any questions, ask your nurse or doctor.        amLODipine-valsartan 10-320 MG tablet Commonly known as: EXFORGE Take 1 tablet by mouth daily.       Allergies: No Known Allergies  Family History: Family History  Problem Relation Age of Onset  . Atrial fibrillation Father   . Prostate cancer Neg Hx   . Bladder Cancer Neg Hx   . Kidney cancer Neg Hx     Social History:   reports that he has never smoked. He has quit using smokeless tobacco. He reports current alcohol use. He reports that he does not use drugs.  ROS: Pertinent ROS in HPI.  Physical Exam: BP (!) 176/73   Pulse 68   Ht 6\' 1"   (1.854 m)   Wt 230 lb (104.3 kg)   BMI 30.34 kg/m   Constitutional:  Alert and oriented, No acute distress. HEENT: House AT, moist mucus membranes.  Trachea midline, no masses. Cardiovascular: No clubbing, cyanosis, or edema. Respiratory: Normal respiratory effort, no increased work of breathing. Skin: No rashes, bruises or suspicious lesions. GU: Declined DRE today Neurologic: Grossly intact, no focal deficits, moving all 4 extremities. Psychiatric: Normal mood and affect.  Laboratory Data:  Results for orders placed or performed in visit on 10/11/20  PSA  Result Value Ref Range   Prostate Specific Ag, Serum 6.3 (H) 0.0 - 4.0 ng/mL    Assessment & Plan:    1. BPH with LUTS - Does not desire to restart prostate medication  - Not interested in pursuing surgical options  - Has done reading on PAE and was interested in IR referral to discuss further  2. Elevated PSA - PSA stable  - Continue to follow up   Follow Up:  - RTC in a year for follow up with PSA prior  I, Ardyth Gal, am acting as a scribe for Dr. John Giovanni.   I have reviewed the above documentation for accuracy and completeness, and I agree with the above.    [name], MD    [facility] [address]  [place], [place] [address] [phone]

## 2020-10-18 ENCOUNTER — Other Ambulatory Visit: Payer: Self-pay

## 2020-10-18 ENCOUNTER — Encounter: Payer: Self-pay | Admitting: Urology

## 2020-10-18 ENCOUNTER — Ambulatory Visit (INDEPENDENT_AMBULATORY_CARE_PROVIDER_SITE_OTHER): Payer: Managed Care, Other (non HMO) | Admitting: Urology

## 2020-10-18 VITALS — BP 176/73 | HR 68 | Ht 73.0 in | Wt 230.0 lb

## 2020-10-18 DIAGNOSIS — N401 Enlarged prostate with lower urinary tract symptoms: Secondary | ICD-10-CM | POA: Diagnosis not present

## 2020-10-18 DIAGNOSIS — R972 Elevated prostate specific antigen [PSA]: Secondary | ICD-10-CM

## 2020-10-18 DIAGNOSIS — N138 Other obstructive and reflux uropathy: Secondary | ICD-10-CM | POA: Diagnosis not present

## 2020-11-15 ENCOUNTER — Other Ambulatory Visit: Payer: Self-pay

## 2021-01-23 ENCOUNTER — Emergency Department
Admission: EM | Admit: 2021-01-23 | Discharge: 2021-01-23 | Disposition: A | Discharge status: Home / Self Care | Payer: Managed Care, Other (non HMO) | Attending: Emergency Medicine | Admitting: Emergency Medicine

## 2021-01-23 ENCOUNTER — Other Ambulatory Visit: Payer: Self-pay

## 2021-01-23 ENCOUNTER — Encounter: Payer: Self-pay | Admitting: Emergency Medicine

## 2021-01-23 DIAGNOSIS — R339 Retention of urine, unspecified: Secondary | ICD-10-CM | POA: Diagnosis present

## 2021-01-23 DIAGNOSIS — Z87891 Personal history of nicotine dependence: Secondary | ICD-10-CM | POA: Insufficient documentation

## 2021-01-23 DIAGNOSIS — Z79899 Other long term (current) drug therapy: Secondary | ICD-10-CM | POA: Insufficient documentation

## 2021-01-23 DIAGNOSIS — I10 Essential (primary) hypertension: Secondary | ICD-10-CM | POA: Diagnosis not present

## 2021-01-23 LAB — CBC WITH DIFFERENTIAL/PLATELET
Abs Immature Granulocytes: 0.02 10*3/uL (ref 0.00–0.07)
Basophils Absolute: 0 10*3/uL (ref 0.0–0.1)
Basophils Relative: 1 %
Eosinophils Absolute: 0 10*3/uL (ref 0.0–0.5)
Eosinophils Relative: 0 %
HCT: 44 % (ref 39.0–52.0)
Hemoglobin: 15.2 g/dL (ref 13.0–17.0)
Immature Granulocytes: 0 %
Lymphocytes Relative: 4 %
Lymphs Abs: 0.3 10*3/uL — ABNORMAL LOW (ref 0.7–4.0)
MCH: 30.6 pg (ref 26.0–34.0)
MCHC: 34.5 g/dL (ref 30.0–36.0)
MCV: 88.5 fL (ref 80.0–100.0)
Monocytes Absolute: 0.6 10*3/uL (ref 0.1–1.0)
Monocytes Relative: 10 %
Neutro Abs: 5.2 10*3/uL (ref 1.7–7.7)
Neutrophils Relative %: 85 %
Platelets: 158 10*3/uL (ref 150–400)
RBC: 4.97 MIL/uL (ref 4.22–5.81)
RDW: 12.2 % (ref 11.5–15.5)
WBC: 6.1 10*3/uL (ref 4.0–10.5)
nRBC: 0 % (ref 0.0–0.2)

## 2021-01-23 LAB — URINALYSIS, COMPLETE (UACMP) WITH MICROSCOPIC
Bilirubin Urine: NEGATIVE
Glucose, UA: NEGATIVE mg/dL
Ketones, ur: NEGATIVE mg/dL
Nitrite: NEGATIVE
Protein, ur: 30 mg/dL — AB
Specific Gravity, Urine: 1.021 (ref 1.005–1.030)
Squamous Epithelial / HPF: NONE SEEN (ref 0–5)
pH: 5 (ref 5.0–8.0)

## 2021-01-23 LAB — BASIC METABOLIC PANEL
Anion gap: 9 (ref 5–15)
BUN: 19 mg/dL (ref 8–23)
CO2: 24 mmol/L (ref 22–32)
Calcium: 9.4 mg/dL (ref 8.9–10.3)
Chloride: 102 mmol/L (ref 98–111)
Creatinine, Ser: 1.06 mg/dL (ref 0.61–1.24)
GFR, Estimated: >60 mL/min (ref >60)
Glucose, Bld: 140 mg/dL — ABNORMAL HIGH (ref 70–99)
Potassium: 4.1 mmol/L (ref 3.5–5.1)
Sodium: 135 mmol/L (ref 135–145)

## 2021-01-23 NOTE — ED Provider Notes (Signed)
Emergency Medicine Provider Triage Evaluation Note  Reginald Mckee , a 65 y.o. male  was evaluated in triage.  Pt complains of urinary retention.  Supposed to have a prostate procedure early next week.  Has not fully voided in 2 days.  Review of Systems  Positive: Urinary retention Negative: Nausea/vomiting  Physical Exam  There were no vitals taken for this visit. Gen:   Awake, mild distress   Resp:  Normal effort  MSK:   Moves extremities without difficulty  Other:  Distended abdomen  Medical Decision Making  Medically screening exam initiated at 6:27 AM.  Appropriate orders placed.  Reginald Mckee was informed that the remainder of the evaluation will be completed by another provider, this initial triage assessment does not replace that evaluation, and the importance of remaining in the ED until their evaluation is complete.  65 year old male presenting with urinary retention.  Will obtain bladder scan, basic lab work, UA while patient is awaiting treatment room.   Paulette Blanch, MD 01/23/21 [phone]

## 2021-01-23 NOTE — ED Provider Notes (Signed)
[facility] Emergency Department Provider Note  Time seen: 9:55 AM  I have reviewed the triage vital signs and the nursing notes.   HISTORY  Chief Complaint Urinary Retention   HPI Reginald Mckee is a 65 y.o. male with a past medical history of arthritis, hypertension, enlarged prostate, presents to the emergency department for urinary retention.  According to the patient over the past 1 to 2 days he has not been able to fully void.  States he is able to urinate very small amounts at times and feels like he needs to urinate every 10 to 15 minutes.  States he has not slept in 24 hours because of the fullness/discomfort in the bladder along with frequent trips to the bathroom to attempt to urinate.  Patient denies any burning or discharge.  No fever.  Patient has a prostatic arterial embolization scheduled for next week.  Follows up with Dr. Bernardo Heater for his enlarged prostate.  Past Medical History:  Diagnosis Date   Acid reflux    Arthritis     Patient Active Problem List   Diagnosis Date Noted   Erectile dysfunction due to arterial insufficiency 10/29/2018   Back pain 05/17/2018   Malignant hypertension 01/02/2018   Elevated PSA 01/02/2018   BPH (benign prostatic hyperplasia) 01/02/2018   Abnormal EKG 01/02/2018   Gastroesophageal reflux disease without esophagitis 01/02/2018   HNP (herniated nucleus pulposus), lumbar 08/13/2015   Lumbar radiculitis 08/13/2015   Chronic low back pain 08/21/2013    Past Surgical History:  Procedure Laterality Date   ELBOW SURGERY Right    HERNIA REPAIR      Prior to Admission medications   Medication Sig Start Date End Date Taking? Authorizing Provider  amLODipine-valsartan (EXFORGE) 10-320 MG tablet Take 1 tablet by mouth daily. 08/10/20   Minna Merritts, MD    No Known Allergies  Family History  Problem Relation Age of Onset   Atrial fibrillation Father    Prostate cancer Neg Hx    Bladder Cancer Neg Hx     Kidney cancer Neg Hx     Social History Social History   Tobacco Use   Smoking status: Never   Smokeless tobacco: Former  Scientific laboratory technician Use: Never used  Substance Use Topics   Alcohol use: Yes    Comment: daily   Drug use: No    Review of Systems Constitutional: Negative for fever. Cardiovascular: Negative for chest pain. Respiratory: Negative for shortness of breath. Gastrointestinal: Negative for abdominal pain, vomiting  Genitourinary: Unable to fully void x2 days. Musculoskeletal: Negative for musculoskeletal complaints Neurological: Negative for headache All other ROS negative  ____________________________________________   PHYSICAL EXAM:  VITAL SIGNS: ED Triage Vitals [01/23/21 0627]  Enc Vitals Group     BP (!) 149/92     Pulse Rate (!) 110     Resp 18     Temp 98.9 F (37.2 C)     Temp Source Oral     SpO2 96 %     Weight 229 lb 15 oz (104.3 kg)     Height '6\' 1"'$  (1.854 m)     Head Circumference      Peak Flow      Pain Score 2     Pain Loc      Pain Edu?      Excl. in [place]?    Constitutional: Alert and oriented. Well appearing and in no distress. Eyes: Normal exam ENT  Head: Normocephalic and atraumatic.      Mouth/Throat: Mucous membranes are moist. Cardiovascular: Normal rate, regular rhythm.  Respiratory: Normal respiratory effort without tachypnea nor retractions. Breath sounds are clear  Gastrointestinal: Soft and nontender. No distention.   Musculoskeletal: Nontender with normal range of motion in all extremities.  Neurologic:  Normal speech and language. No gross focal neurologic deficits  Skin:  Skin is warm, dry and intact.  Psychiatric: Mood and affect are normal.  ____________________________________________    INITIAL IMPRESSION / ASSESSMENT AND PLAN / ED COURSE  Pertinent labs & imaging results that were available during my care of the patient were reviewed by me and considered in my medical decision making (see chart  for details).   Patient presents emergency department for inability to urinate x2 days.  States he feels like he needs to urinate every 10 to 15 minutes only very little amount comes out.  States it is getting worse and now he is feeling lower abdominal distention/discomfort.  Patient has a history of an enlarged prostate follows up with Dr. Bernardo Heater.  Patient has a procedure scheduled next week for prostatic arterial embolization for his enlarged prostate.  Patient is greater than 400 cc on postvoid bladder scan, states worsening discomfort/distention.  Foley catheter placed and patient feels much better.  Patient has no abdominal tenderness after Foley catheter placed.  Leg bag in place.  Patient will call Dr. Bernardo Heater as well as his other physician tomorrow to inform them of the catheter.  Discussed with the patient follow-up in 7 to 10 days with Dr. Bernardo Heater for catheter removal.  Discussed my typical return precautions.  [name] was evaluated in Emergency Department on 01/23/2021 for the symptoms described in the history of present illness. He was evaluated in the context of the global COVID-19 pandemic, which necessitated consideration that the patient might be at risk for infection with the SARS-CoV-2 virus that causes COVID-19. Institutional protocols and algorithms that pertain to the evaluation of patients at risk for COVID-19 are in a state of rapid change based on information released by regulatory bodies including the CDC and federal and state organizations. These policies and algorithms were followed during the patient's care in the ED.  ____________________________________________   FINAL CLINICAL IMPRESSION(S) / ED DIAGNOSES  urinary retention   Harvest Dark, MD 01/23/21 [phone]

## 2021-01-23 NOTE — ED Notes (Signed)
See triage note  Presents with some diff urinating  Hx of prostate issues  On arrival had foley cath placed

## 2021-01-23 NOTE — ED Triage Notes (Signed)
Pt comes into the ED via POV c/o urinary retention x 2 days.  PT states he has been able to "dribble" a little bit here and there but has been unable to fully void.  Pt has procedure scheduled on Tuesday for prosthetic arterial embolization. Pt has distended stomach at this time.

## 2021-01-23 NOTE — Discharge Instructions (Addendum)
Please call your urologist tomorrow to inform them of the catheter.  Return to the emergency department for any abdominal pain, if your catheter does not drain urine for more than 6 hours or you develop a fever.  Please follow-up with your urologist in 7 to 10 days for catheter removal.

## 2021-01-26 ENCOUNTER — Telehealth: Payer: Self-pay

## 2021-01-26 NOTE — Telephone Encounter (Signed)
Patient called stating he has not had any urine drainage in his bag from his catheter in 6-8 hours. Patient state he is not in any discomfort, feeling full, fever, chills, Nausea, Vomiting or any other symptoms. Patient states he is leaking from his catheter and has the urge and the urine comes out around the catheter. As per Judson Roch if pt is not in discomfort pt can come in first thing in the morning. If pt develops any discomfort, not draining at all or any other symptoms pt will need to go to ER. Pt verbalized understanding. Appt made at 8:30 am 01/27/2021

## 2021-01-27 ENCOUNTER — Ambulatory Visit: Payer: Self-pay | Admitting: Physician Assistant

## 2021-01-30 ENCOUNTER — Emergency Department
Admission: EM | Admit: 2021-01-30 | Discharge: 2021-01-30 | Disposition: A | Discharge status: Home / Self Care | Payer: Managed Care, Other (non HMO) | Attending: Emergency Medicine | Admitting: Emergency Medicine

## 2021-01-30 ENCOUNTER — Other Ambulatory Visit: Payer: Self-pay

## 2021-01-30 ENCOUNTER — Encounter: Payer: Self-pay | Admitting: Emergency Medicine

## 2021-01-30 DIAGNOSIS — T83098A Other mechanical complication of other indwelling urethral catheter, initial encounter: Secondary | ICD-10-CM | POA: Diagnosis not present

## 2021-01-30 DIAGNOSIS — T839XXA Unspecified complication of genitourinary prosthetic device, implant and graft, initial encounter: Secondary | ICD-10-CM

## 2021-01-30 DIAGNOSIS — B9689 Other specified bacterial agents as the cause of diseases classified elsewhere: Secondary | ICD-10-CM | POA: Diagnosis not present

## 2021-01-30 DIAGNOSIS — Z87891 Personal history of nicotine dependence: Secondary | ICD-10-CM | POA: Insufficient documentation

## 2021-01-30 DIAGNOSIS — Z79899 Other long term (current) drug therapy: Secondary | ICD-10-CM | POA: Diagnosis not present

## 2021-01-30 DIAGNOSIS — N390 Urinary tract infection, site not specified: Secondary | ICD-10-CM | POA: Diagnosis not present

## 2021-01-30 DIAGNOSIS — I10 Essential (primary) hypertension: Secondary | ICD-10-CM | POA: Insufficient documentation

## 2021-01-30 LAB — URINALYSIS, COMPLETE (UACMP) WITH MICROSCOPIC
Bilirubin Urine: NEGATIVE
Glucose, UA: NEGATIVE mg/dL
Ketones, ur: NEGATIVE mg/dL
Nitrite: NEGATIVE
Protein, ur: 100 mg/dL — AB
RBC / HPF: >50 RBC/hpf — ABNORMAL HIGH (ref 0–5)
Specific Gravity, Urine: 1.018 (ref 1.005–1.030)
Squamous Epithelial / HPF: NONE SEEN (ref 0–5)
WBC, UA: >50 WBC/hpf — ABNORMAL HIGH (ref 0–5)
pH: 6 (ref 5.0–8.0)

## 2021-01-30 LAB — BASIC METABOLIC PANEL
Anion gap: 8 (ref 5–15)
BUN: 20 mg/dL (ref 8–23)
CO2: 23 mmol/L (ref 22–32)
Calcium: 8.9 mg/dL (ref 8.9–10.3)
Chloride: 105 mmol/L (ref 98–111)
Creatinine, Ser: 0.87 mg/dL (ref 0.61–1.24)
GFR, Estimated: >60 mL/min (ref >60)
Glucose, Bld: 116 mg/dL — ABNORMAL HIGH (ref 70–99)
Potassium: 4.1 mmol/L (ref 3.5–5.1)
Sodium: 136 mmol/L (ref 135–145)

## 2021-01-30 LAB — CBC
HCT: 43.7 % (ref 39.0–52.0)
Hemoglobin: 15.3 g/dL (ref 13.0–17.0)
MCH: 30.8 pg (ref 26.0–34.0)
MCHC: 35 g/dL (ref 30.0–36.0)
MCV: 87.9 fL (ref 80.0–100.0)
Platelets: 136 10*3/uL — ABNORMAL LOW (ref 150–400)
RBC: 4.97 MIL/uL (ref 4.22–5.81)
RDW: 11.6 % (ref 11.5–15.5)
WBC: 6 10*3/uL (ref 4.0–10.5)
nRBC: 0 % (ref 0.0–0.2)

## 2021-01-30 MED ORDER — SODIUM CHLORIDE 0.9 % IV BOLUS (SEPSIS)
1000.0000 mL | Freq: Once | INTRAVENOUS | Status: AC
  Administered 2021-01-30: 1000 mL via INTRAVENOUS

## 2021-01-30 MED ORDER — TAMSULOSIN HCL 0.4 MG PO CAPS: 0.4000 mg | ORAL_CAPSULE | Freq: Every day | ORAL | 0 refills | Status: DC

## 2021-01-30 MED ORDER — CEPHALEXIN 500 MG PO CAPS: 500.0000 mg | ORAL_CAPSULE | Freq: Two times a day (BID) | ORAL | 0 refills | Status: DC

## 2021-01-30 MED ORDER — TAMSULOSIN HCL 0.4 MG PO CAPS
0.4000 mg | ORAL_CAPSULE | Freq: Once | ORAL | Status: AC
  Administered 2021-01-30: 0.4 mg via ORAL
  Filled 2021-01-30: qty 1

## 2021-01-30 MED ORDER — SODIUM CHLORIDE 0.9 % IV SOLN
1.0000 g | Freq: Once | INTRAVENOUS | Status: AC
  Administered 2021-01-30: 1 g via INTRAVENOUS
  Filled 2021-01-30: qty 10

## 2021-01-30 NOTE — ED Notes (Signed)
Bladder scan residual -261m

## 2021-01-30 NOTE — ED Notes (Signed)
E-signature pad unavailable - Pt verbalized understanding of D/C information - no additional concerns at this time.  

## 2021-01-30 NOTE — ED Notes (Signed)
Pt. Has not had the urge to urinate. Will continue to monitor.

## 2021-01-30 NOTE — ED Notes (Signed)
Pts foley bag swapped for a leg bag to promote comfort. Pt provided briefs and paper scrub pants.

## 2021-01-30 NOTE — ED Notes (Signed)
Pt endorses dribbling around his foley tube when he feels the urge to urinate. Pt denies straining - Pt educated on the foley catheter care and expectations until seen by Urology. MD aware.

## 2021-01-30 NOTE — ED Notes (Signed)
Per MD instruction, pt's foley irrigated with NS. 367m NS flushed, 3031mreturned. Catheter flushed until fluid returned clear. This RN noticed a few small clots flush, but no large clots, or large amount of blood.

## 2021-01-30 NOTE — ED Provider Notes (Signed)
[facility] Emergency Department Provider Note  ____________________________________________   Event Date/Time   First MD Initiated Contact with Patient 01/30/21 [phone]     (approximate)  I have reviewed the triage vital signs and the nursing notes.   HISTORY  Chief Complaint Hematuria and Foley Catheter Problem    HPI Reginald Mckee is a 65 y.o. male with history of BPH followed by Dr. Bernardo Heater who presents to the emergency department with complaints of leaking around his Foley catheter today.  No fevers, chills, abdominal pain, nausea, vomiting or diarrhea.  He states that there have been small clots and blood in his urine.  He denies being on any blood thinners.  Has follow-up with urology on Tuesday, August 2 to have Foley catheter removed.  Foley catheter was replaced in triage but he states it is still leaking around the catheter.  Patient states he is scheduled for prostatic artery embolization this week.        Past Medical History:  Diagnosis Date   Acid reflux    Arthritis     Patient Active Problem List   Diagnosis Date Noted   Erectile dysfunction due to arterial insufficiency 10/29/2018   Back pain 05/17/2018   Malignant hypertension 01/02/2018   Elevated PSA 01/02/2018   BPH (benign prostatic hyperplasia) 01/02/2018   Abnormal EKG 01/02/2018   Gastroesophageal reflux disease without esophagitis 01/02/2018   HNP (herniated nucleus pulposus), lumbar 08/13/2015   Lumbar radiculitis 08/13/2015   Chronic low back pain 08/21/2013    Past Surgical History:  Procedure Laterality Date   ELBOW SURGERY Right    HERNIA REPAIR      Prior to Admission medications   Medication Sig Start Date End Date Taking? Authorizing Provider  amLODipine-valsartan (EXFORGE) 10-320 MG tablet Take 1 tablet by mouth daily. 08/10/20   Minna Merritts, MD    Allergies Patient has no known allergies.  Family History  Problem Relation Age of Onset    Atrial fibrillation Father    Prostate cancer Neg Hx    Bladder Cancer Neg Hx    Kidney cancer Neg Hx     Social History Social History   Tobacco Use   Smoking status: Never   Smokeless tobacco: Former  Scientific laboratory technician Use: Never used  Substance Use Topics   Alcohol use: Yes    Comment: daily   Drug use: No    Review of Systems Constitutional: No fever. Eyes: No visual changes. ENT: No sore throat. Cardiovascular: Denies chest pain. Respiratory: Denies shortness of breath. Gastrointestinal: No nausea, vomiting, diarrhea. Genitourinary: Negative for dysuria. Musculoskeletal: Negative for back pain. Skin: Negative for rash. Neurological: Negative for focal weakness or numbness.  ____________________________________________   PHYSICAL EXAM:  VITAL SIGNS: ED Triage Vitals  Enc Vitals Group     BP 01/30/21 0116 (!) 178/74     Pulse Rate 01/30/21 0116 81     Resp 01/30/21 0116 20     Temp 01/30/21 0116 98.3 F (36.8 C)     Temp Source 01/30/21 0116 Oral     SpO2 01/30/21 0116 96 %     Weight 01/30/21 0117 229 lb 15 oz (104.3 kg)     Height 01/30/21 0117 '6\' 1"'$  (1.854 m)     Head Circumference --      Peak Flow --      Pain Score 01/30/21 0116 0     Pain Loc --      Pain Edu? --  Excl. in GC? --    CONSTITUTIONAL: Alert and oriented and responds appropriately to questions. Well-appearing; well-nourished HEAD: Normocephalic EYES: Conjunctivae clear, pupils appear equal, EOM appear intact ENT: normal nose; moist mucous membranes NECK: Supple, normal ROM CARD: RRR; S1 and S2 appreciated; no murmurs, no clicks, no rubs, no gallops RESP: Normal chest excursion without splinting or tachypnea; breath sounds clear and equal bilaterally; no wheezes, no rhonchi, no rales, no hypoxia or respiratory distress, speaking full sentences ABD/GI: Normal bowel sounds; non-distended; soft, non-tender, no rebound, no guarding, no peritoneal signs, no hepatosplenomegaly GU:  Patient has a Foley catheter in place.  There is a small amount of urine leaking around the Foley catheter out of the urethral meatus.  He does have some bloody urine but no clots seen in the Foley catheter bag.  No scrotal swelling, redness, ecchymosis, necrosis. BACK: The back appears normal EXT: Normal ROM in all joints; no deformity noted, no edema; no cyanosis SKIN: Normal color for age and race; warm; no rash on exposed skin NEURO: Moves all extremities equally PSYCH: The patient's mood and manner are appropriate.  ____________________________________________   LABS (all labs ordered are listed, but only abnormal results are displayed)  Labs Reviewed  URINALYSIS, COMPLETE (UACMP) WITH MICROSCOPIC - Abnormal; Notable for the following components:      Result Value   Color, Urine BROWN (*)    APPearance CLOUDY (*)    Hgb urine dipstick LARGE (*)    Protein, ur 100 (*)    Leukocytes,Ua LARGE (*)    RBC / HPF >50 (*)    WBC, UA >50 (*)    Bacteria, UA MANY (*)    All other components within normal limits  CBC - Abnormal; Notable for the following components:   Platelets 136 (*)    All other components within normal limits  BASIC METABOLIC PANEL - Abnormal; Notable for the following components:   Glucose, Bld 116 (*)    All other components within normal limits  URINE CULTURE   ____________________________________________  EKG   ____________________________________________  RADIOLOGY I, Torrell Krutz, personally viewed and evaluated these images (plain radiographs) as part of my medical decision making, as well as reviewing the written report by the radiologist.  ED MD interpretation:    Official radiology report(s): No results found.  ____________________________________________   PROCEDURES  Procedure(s) performed (including Critical Care):  Procedures    ____________________________________________   INITIAL IMPRESSION / ASSESSMENT AND PLAN / ED  COURSE  As part of my medical decision making, I reviewed the following data within the [facility] notes reviewed and incorporated, Labs reviewed , Old chart reviewed, and Notes from prior ED visits         Patient here with leaking around his Foley catheter and hematuria.  Urine appears infected.  We will add on urine culture and give Rocephin.  Foley catheter was exchanged in triage she states it is still leaking around the catheter.  Will irrigate his bladder given he does have some blood to see if this helps with drainage.  Patient is scheduled to see urology in 2 days.  He does not appear to be retaining urine at this time.  His abdominal exam is benign.  Labs obtained in triage are unremarkable.  ED PROGRESS  5:20 AM  Pt's Foley catheter was irrigated and he has been observed.  Urine is straw-colored now without blood or clots but he is still leaking around the catheter.  We discussed  options of removing the catheter given it is supposed to come out in 48 hours versus replacing with a larger catheter.  He would like to remove it at this time.  Will give IV fluids and oral fluids and see if he is able to void on his own prior to discharge.  7:25 AM  Pt has been able to urinate approximately 200 mL on his own.  He states he feels he is emptying his bladder but is not sure.  We will BladderScan him.  He would really prefer not to have another catheter placed at this time.  He is not currently on Flomax but is not sure if he is ever tried this medication before.  Will give Flomax here and anticipate discharging him home with a prescription for the same as well as Keflex for his UTI.  7:30 AM  Bladder scan reveals 237 mL.  Patient states he feels the urge to urinate but is only getting out small amounts at this time.  He agrees to replacing his Foley catheter.  Will replace here in the ED and discharged home with outpatient urology follow-up.  He has already scheduled to see  them in 2 days.   At this time, I do not feel there is any life-threatening condition present. I have reviewed, interpreted and discussed all results (EKG, imaging, lab, urine as appropriate) and exam findings with patient/family. I have reviewed nursing notes and appropriate previous records.  I feel the patient is safe to be discharged home without further emergent workup and can continue workup as an outpatient as needed. Discussed usual and customary return precautions. Patient/family verbalize understanding and are comfortable with this plan.  Outpatient follow-up has been provided as needed. All questions have been answered.   ____________________________________________   FINAL CLINICAL IMPRESSION(S) / ED DIAGNOSES  Final diagnoses:  Foley catheter problem, initial encounter [facility])  Acute UTI     ED Discharge Orders     None       *Please note:  Reginald Mckee was evaluated in Emergency Department on 01/30/2021 for the symptoms described in the history of present illness. He was evaluated in the context of the global COVID-19 pandemic, which necessitated consideration that the patient might be at risk for infection with the SARS-CoV-2 virus that causes COVID-19. Institutional protocols and algorithms that pertain to the evaluation of patients at risk for COVID-19 are in a state of rapid change based on information released by regulatory bodies including the CDC and federal and state organizations. These policies and algorithms were followed during the patient's care in the ED.  Some ED evaluations and interventions may be delayed as a result of limited staffing during and the pandemic.*   Note:  This document was prepared using Dragon voice recognition software and may include unintentional dictation errors.    Semaje Kinker, Delice Bison, DO 01/30/21 [phone]

## 2021-01-30 NOTE — ED Notes (Signed)
Pt. Ambulating in room, so far able to  Urinate approx52m urine.

## 2021-01-30 NOTE — ED Notes (Signed)
Per MD order, pt's catheter removed without incident. Pt. Given water to drink, a urinal at bedside. Will continue to monitor urine output.

## 2021-01-30 NOTE — ED Provider Notes (Signed)
Patient reported concerned that the Foley was leaking.  Evaluated patient and no active drainage at that time.  Discussed with patient the next step would be to place a three-way catheter that is larger.  However he does not have any clots in his urine and is just blood-tinged most likely from traumatic placement versus from UTI.  Given there is urine in the Foley bag patient would like to hold off and monitor symptoms at home and will return to the ER if he is unable to get urine out.  Will do repeat bladder scan to ensure proper emptying and if normal will dc patient.    Vanessa [place], MD 01/30/21 1001

## 2021-01-30 NOTE — ED Triage Notes (Addendum)
Pt arrived via POV with reports of hematuria, pt has foley cath that was placed in ED last Sunday for urinary retention, states he's due to go to Urology on Tuesday.  Pt reports catheter not draining properly as well and is leaking around his penis.  On arrival pt did not have foley connected to anything and had a plastic grocery bag with paper towel in underwear that patient described as a "make shift diaper" due to leaking catheter.  Pt states that he has been taking the leg bag off and on from the catheter all day. Advised patient on increased risk of infection due to nothing being attached to the catheter.

## 2021-02-01 ENCOUNTER — Other Ambulatory Visit: Payer: Self-pay

## 2021-02-01 ENCOUNTER — Ambulatory Visit: Payer: Managed Care, Other (non HMO) | Admitting: Physician Assistant

## 2021-02-01 ENCOUNTER — Ambulatory Visit (INDEPENDENT_AMBULATORY_CARE_PROVIDER_SITE_OTHER): Payer: Managed Care, Other (non HMO) | Admitting: Physician Assistant

## 2021-02-01 DIAGNOSIS — N401 Enlarged prostate with lower urinary tract symptoms: Secondary | ICD-10-CM | POA: Diagnosis not present

## 2021-02-01 DIAGNOSIS — N138 Other obstructive and reflux uropathy: Secondary | ICD-10-CM | POA: Diagnosis not present

## 2021-02-01 LAB — URINE CULTURE: Culture: >=100000 — AB

## 2021-02-01 LAB — BLADDER SCAN AMB NON-IMAGING

## 2021-02-01 MED ORDER — SULFAMETHOXAZOLE-TRIMETHOPRIM 800-160 MG PO TABS: 1.0000 | ORAL_TABLET | Freq: Two times a day (BID) | ORAL | 0 refills | Status: AC

## 2021-02-01 MED ORDER — SILODOSIN 8 MG PO CAPS: 8.0000 mg | ORAL_CAPSULE | Freq: Every day | ORAL | 1 refills | Status: DC

## 2021-02-01 NOTE — Progress Notes (Signed)
02/01/2021 9:42 AM   Reginald Mckee 1955-09-30 [id number]  CC: Chief Complaint  Patient presents with   Urinary Retention   HPI: Reginald Mckee is a 65 y.o. male with PMH BPH with a recent history of urinary retention requiring Foley catheter placement and subsequent replacement in the emergency department and who is scheduled for PAE in 14 days who presents today for voiding trial.   His catheter was originally placed in the ED 9 days ago after he had been unable to void for 2 days.  He returned to the emergency department 2 days ago with reports of urine leaking around his Foley catheter tubing.  There were some clots noted in his bag as well as pink urine in his catheter was irrigated and replaced.  He was also started on Flomax and Keflex at that time.  He reports severe stuffy nose on Flomax and has since discontinued this.  He remains on Keflex for possible UTI, however his urine culture has since resulted with cefazolin resistant Enterobacter cloacae.  Patient reports he has had severe stuffy nose on Flomax previously.  He thinks he has taken silodosin in the past and tolerated it well, though it did not fully relieve his obstructive urinary symptoms.  Foley catheter in place this morning draining clear, yellow urine.  No clots visualized.  Patient reports he had some dark urine during catheterization, but nothing overtly bloody and he did not notice clots except for when in the emergency department 2 days ago.  He is moving forward with plans for PAE in 2 weeks with Dr. Tasia Catchings at [facility] and does not wish to pursue alternative bladder outlet procedures.  Foley catheter removed in clinic in the morning, see procedure note below.  Patient returned to clinic in the afternoon for repeat PVR.  He reports drinking 32oz of fluid.  He has been able to urinate.  PVR 163m.  PMH: Past Medical History:  Diagnosis Date   Acid reflux    Arthritis     Surgical History: Past Surgical History:   Procedure Laterality Date   ELBOW SURGERY Right    HERNIA REPAIR      Home Medications:  Allergies as of 02/01/2021   No Known Allergies      Medication List        Accurate as of February 01, 2021  9:42 AM. If you have any questions, ask your nurse or doctor.          amLODipine-valsartan 10-320 MG tablet Commonly known as: EXFORGE Take 1 tablet by mouth daily.   cephALEXin 500 MG capsule Commonly known as: KEFLEX Take 1 capsule (500 mg total) by mouth 2 (two) times daily.   tamsulosin 0.4 MG Caps capsule Commonly known as: Flomax Take 1 capsule (0.4 mg total) by mouth daily.        Allergies:  No Known Allergies  Family History: Family History  Problem Relation Age of Onset   Atrial fibrillation Father    Prostate cancer Neg Hx    Bladder Cancer Neg Hx    Kidney cancer Neg Hx     Social History:   reports that he has never smoked. He has quit using smokeless tobacco. He reports current alcohol use. He reports that he does not use drugs.  Physical Exam: There were no vitals taken for this visit.  Constitutional:  Alert and oriented, no acute distress, nontoxic appearing HEENT: [place], AT Cardiovascular: No clubbing, cyanosis, or edema Respiratory: Normal respiratory effort, no increased  work of breathing Skin: No rashes, bruises or suspicious lesions Neurologic: Grossly intact, no focal deficits, moving all 4 extremities Psychiatric: Normal mood and affect  Laboratory Data: Results for orders placed or performed in visit on 02/01/21  BLADDER SCAN AMB NON-IMAGING  Result Value Ref Range   Scan Result 176m    Catheter Removal  Patient is present today for a catheter removal.  870mof water was drained from the balloon. A 16FR foley cath was removed from the bladder no complications were noted . Patient tolerated well.  Performed by: SaDebroah LoopPA-C   Assessment & Plan:   1. BPH with obstruction/lower urinary tract symptoms Voiding trial  passed, though it appears he has been on culture inappropriate antibiotics.  We will switch him to culture appropriate Bactrim for 7 days to complete treatment.  Additionally, I recommended starting silodosin while he awaits upcoming PAE to reduce his risk for recurrent retention.  He agreed, prescription sent to his pharmacy.  We discussed possible CIC teaching and will plan to defer this today, however patient states he will call back later this week if he feels his urine stream is slowing so that he can try to avoid any possible emergency trips to the ED.  I think this is reasonable. - BLADDER SCAN AMB NON-IMAGING - sulfamethoxazole-trimethoprim (BACTRIM DS) 800-160 MG tablet; Take 1 tablet by mouth 2 (two) times daily for 7 days.  Dispense: 14 tablet; Refill: 0 - silodosin (RAPAFLO) 8 MG CAPS capsule; Take 1 capsule (8 mg total) by mouth daily with breakfast.  Dispense: 30 capsule; Refill: 1   Return if symptoms worsen or fail to improve.  SaDebroah LoopPA-C  [facility] [address]SuMcGrawuMcLendon-[place]ishol[place] [phone]

## 2021-06-10 ENCOUNTER — Telehealth: Payer: Self-pay

## 2021-06-10 NOTE — Telephone Encounter (Signed)
Patient called asking for an appointment for CIC teaching as per his conversation with Sam at his last visit. Pt states he is urinating but has been more difficult. I Offered patient an appointment this morning at 9 am that he declined and I will call back Monday. Advised pt that if he is unable to void on his own over the weekend to go to the ER.

## 2021-07-19 ENCOUNTER — Encounter: Payer: Self-pay | Admitting: Urology

## 2021-07-19 ENCOUNTER — Other Ambulatory Visit: Payer: Self-pay

## 2021-07-19 ENCOUNTER — Ambulatory Visit (INDEPENDENT_AMBULATORY_CARE_PROVIDER_SITE_OTHER): Payer: Managed Care, Other (non HMO) | Admitting: Urology

## 2021-07-19 VITALS — BP 186/67 | HR 71

## 2021-07-19 DIAGNOSIS — N138 Other obstructive and reflux uropathy: Secondary | ICD-10-CM | POA: Diagnosis not present

## 2021-07-19 DIAGNOSIS — N401 Enlarged prostate with lower urinary tract symptoms: Secondary | ICD-10-CM

## 2021-07-19 LAB — URINALYSIS, COMPLETE
Bilirubin, UA: NEGATIVE
Glucose, UA: NEGATIVE
Leukocytes,UA: NEGATIVE
Nitrite, UA: NEGATIVE
Protein,UA: NEGATIVE
RBC, UA: NEGATIVE
Specific Gravity, UA: 1.015 (ref 1.005–1.030)
Urobilinogen, Ur: 1 mg/dL (ref 0.2–1.0)
pH, UA: 6.5 (ref 5.0–7.5)

## 2021-07-19 LAB — MICROSCOPIC EXAMINATION
Bacteria, UA: NONE SEEN
Epithelial Cells (non renal): NONE SEEN /hpf (ref 0–10)

## 2021-07-19 LAB — BLADDER SCAN AMB NON-IMAGING

## 2021-07-19 NOTE — Progress Notes (Signed)
07/19/2021 9:20 AM   Reginald Mckee [birth year] [id number]  Referring provider: No referring provider defined for this encounter.  Chief Complaint  Patient presents with   Urinary Retention   Urological history: 1. Elevated PSA -PSA Trend Component     Latest Ref Rng & Units 07/27/2017 11/02/2017 04/29/2018 10/17/2018  Prostate Specific Ag, Serum     0.0 - 4.0 ng/mL 4.8 (H) 4.1 (H) 4.3 (H) 3.9   Component     Latest Ref Rng & Units 10/17/2019 04/16/2020 10/11/2020  Prostate Specific Ag, Serum     0.0 - 4.0 ng/mL 6.3 (H) 5.3 (H) 6.3 (H)   2. BPH with LU TS -MR 2019 prostate volume 84 cc  -Uroxatrol - stuffy nose -tadalafil - stuffy nose -Flomax - stuffy nose -Proscar - sexual side effects -treated with PAE in 01/2021 -considering repeat PAE  3. Nocturia Risk factors for nocturia: hypertension, back pain, sleep apnea and BPH.    -has untreated sleep apnea  HPI: Reginald Mckee is a 66 y.o. male who presents today as a walk in for frequency, urgency, leakage of urine, difficulty urinating and a weak urinary stream.  PVR 180 mL  UA negative     He states during the day he feels he is able to urinate and empty his bladder, but the nighttime is when it becomes worse.  He cannot urinate well and his bladder fills up causing him a very restless night.  Patient denies any modifying or aggravating factors.  Patient denies any gross hematuria, dysuria or suprapubic/flank pain.  Patient denies any fevers, chills, nausea or vomiting.    PMH: Past Medical History:  Diagnosis Date   Acid reflux    Arthritis     Surgical History: Past Surgical History:  Procedure Laterality Date   ELBOW SURGERY Right    HERNIA REPAIR      Home Medications:  Allergies as of 07/19/2021   No Known Allergies      Medication List        Accurate as of July 19, 2021  9:20 AM. If you have any questions, ask your nurse or doctor.          STOP taking these medications     cephALEXin 500 MG capsule Commonly known as: KEFLEX Stopped by: Airon Sahni, PA-C       TAKE these medications    amLODipine-valsartan 10-320 MG tablet Commonly known as: EXFORGE Take 1 tablet by mouth daily.   silodosin 8 MG Caps capsule Commonly known as: RAPAFLO Take 1 capsule (8 mg total) by mouth daily with breakfast.        Allergies: No Known Allergies  Family History: Family History  Problem Relation Age of Onset   Atrial fibrillation Father    Prostate cancer Neg Hx    Bladder Cancer Neg Hx    Kidney cancer Neg Hx     Social History:  reports that he has never smoked. He has quit using smokeless tobacco. He reports current alcohol use. He reports that he does not use drugs.  ROS: Pertinent ROS in HPI  Physical Exam: BP (!) 186/67    Pulse 71   Constitutional:  Well nourished. Alert and oriented, No acute distress. HEENT: [place] AT, mask in place.  Trachea midline Cardiovascular: No clubbing, cyanosis, or edema. Respiratory: Normal respiratory effort, no increased work of breathing. GU: No CVA tenderness.  No bladder fullness or masses.  Patient with uncircumcised phallus. Foreskin easily retracted  Urethral meatus is  patent.  No penile discharge. No penile lesions or rashes. Scrotum without lesions, cysts, rashes and/or edema.   Neurologic: Grossly intact, no focal deficits, moving all 4 extremities. Psychiatric: Normal mood and affect.  Laboratory Data: Lab Results  Component Value Date   WBC 6.0 01/30/2021   HGB 15.3 01/30/2021   HCT 43.7 01/30/2021   MCV 87.9 01/30/2021   PLT 136 (L) 01/30/2021    Lab Results  Component Value Date   CREATININE 0.87 01/30/2021    Lab Results  Component Value Date   TESTOSTERONE 363 08/30/2020        Component Value Date/Time   CHOL 168 08/30/2020 0737   HDL 64 08/30/2020 0737   CHOLHDL 2.6 08/30/2020 0737   VLDL 18 08/30/2020 0737   LDLCALC 86 08/30/2020 0737    Lab Results  Component Value  Date   AST 19 08/30/2020   Lab Results  Component Value Date   ALT 17 08/30/2020   Urinalysis Component     Latest Ref Rng & Units 07/19/2021  Specific Gravity, UA     1.005 - 1.030 1.015  pH, UA     5.0 - 7.5 6.5  Color, UA     Yellow Yellow  Appearance Ur     Clear Clear  Leukocytes,UA     Negative Negative  Protein,UA     Negative/Trace Negative  Glucose, UA     Negative Negative  Ketones, UA     Negative Trace (A)  RBC, UA     Negative Negative  Bilirubin, UA     Negative Negative  Urobilinogen, Ur     0.2 - 1.0 mg/dL 1.0  Nitrite, UA     Negative Negative  Microscopic Examination      See below:   Component     Latest Ref Rng & Units 07/19/2021  WBC, UA     0 - 5 /hpf 0-5  RBC     0 - 2 /hpf 0-2  Epithelial Cells (non renal)     0 - 10 /hpf None seen  Bacteria, UA     None seen/Few None seen     I have reviewed the labs.   Pertinent Imaging:  07/19/21 08:21  Scan Result 132mL    In and Out Catheterization Patient is present today for a I & O catheterization due to incomplete bladder emptying and wanting to be trained in CIC.  Patient was cleaned and prepped in a sterile fashion with betadine . A 16 FR cath was inserted by Mr. Rummell and no complications were noted , 200 ml of urine return was noted, urine was dark yellow in color.     There was blood seen in the catheter tip upon withdrawal, I reassured the patient that this is a normal phenomenon when straight cathing.  Assessment & Plan:    1. BPH with obstruction/lower urinary tract symptoms -Urinalysis, Complete -Bladder Scan (Post Void Residual) in office -Scheduled for repeat PAE in February -Patient will straight cath twice daily indefinitely for urinary retention -prescription sent for catheters   Return for Keep appointment with Dr. Bernardo Heater in April .  These notes generated with voice recognition software. I apologize for typographical errors.  Zara Council,  PA-C  [facility] [address]  [place], [place] [address] [phone]

## 2021-08-02 ENCOUNTER — Other Ambulatory Visit: Payer: Self-pay | Admitting: Cardiovascular Disease

## 2021-08-02 ENCOUNTER — Telehealth: Payer: Self-pay | Admitting: Cardiovascular Disease

## 2021-08-02 NOTE — Telephone Encounter (Signed)
Attempted to contact to schedule ROV.

## 2021-08-02 NOTE — Telephone Encounter (Signed)
Patient calling to get [facility] to order fu labs to retest testosterone .  He states he ordered the last one.

## 2021-08-28 NOTE — Progress Notes (Signed)
Cardiology Office Note  Date:  08/29/2021   ID:  Reginald Mckee, DOB 1956/01/26, MRN [id number]  PCP:  System, Provider Not In  Urology:Reginald Mckee  Chief Complaint  Patient presents with   12 month follow up     "Doing well." Patient c/o right discoloration on right ankle. Medications reviewed by the patient verbally.     HPI:  [name] Is a 66 year old gentleman with past medical history of BPH, Elevated PSA Hypertension Remote social smoker Chronic back problem GERD History of leg swelling Presents for f/u of his hypertension  LOV 2/22 On last clinic visit, low testosterone, Was out of his blood pressure medications Followed by urology for prostate pathology Had prostate ablation, with redo  Pressure at home 397 - 673 systolic, sometimes 419  Side effects on various prostate medications "I don't talk with urology about testerosterone"  On diet, lost 20 pounds  Property [employer], [address],  Previous total cholesterol 168  EKG personally reviewed by myself on todays visit Shows normal sinus rhythm rate 64  PMH:   has a past medical history of Acid reflux and Arthritis.  PSH:    Past Surgical History:  Procedure Laterality Date   ELBOW SURGERY Right    HERNIA REPAIR      Current Outpatient Medications  Medication Sig Dispense Refill   amLODipine-valsartan (EXFORGE) 10-320 MG tablet TAKE 1 TABLET BY MOUTH DAILY 30 tablet 0   silodosin (RAPAFLO) 8 MG CAPS capsule Take 1 capsule (8 mg total) by mouth daily with breakfast. (Patient not taking: Reported on 07/19/2021) 30 capsule 1   No current facility-administered medications for this visit.    Allergies:   Cephalexin   Social History:  The patient  reports that he has never smoked. He has quit using smokeless tobacco. He reports current alcohol use. He reports that he does not use drugs.   Family History:   family history includes Atrial fibrillation in his father.    Review of  Systems: Review of Systems  Constitutional: Negative.   HENT: Negative.    Respiratory: Negative.    Cardiovascular: Negative.   Gastrointestinal: Negative.   Musculoskeletal: Negative.   Neurological: Negative.   Psychiatric/Behavioral: Negative.    All other systems reviewed and are negative.   PHYSICAL EXAM: VS:  BP (!) 150/60 (BP Location: Left Arm, Patient Position: Sitting, Cuff Size: Normal)    Pulse 64    Ht 6\' 1"  (1.854 m)    Wt 212 lb 6 oz (96.3 kg)    SpO2 98%    BMI 28.02 kg/m  , BMI Body mass index is 28.02 kg/m. Constitutional:  oriented to person, place, and time. No distress.  HENT:  Head: Grossly normal Eyes:  no discharge. No scleral icterus.  Neck: No JVD, no carotid bruits  Cardiovascular: Regular rate and rhythm, no murmurs appreciated Pulmonary/Chest: Clear to auscultation bilaterally, no wheezes or rails Abdominal: Soft.  no distension.  no tenderness.  Musculoskeletal: Normal range of motion Neurological:  normal muscle tone. Coordination normal. No atrophy Skin: Skin warm and dry Psychiatric: normal affect, pleasant   Recent Labs: 08/30/2020: ALT 17 01/30/2021: BUN 20; Creatinine, Ser 0.87; Hemoglobin 15.3; Platelets 136; Potassium 4.1; Sodium 136    Lipid Panel Lab Results  Component Value Date   CHOL 168 08/30/2020   HDL 64 08/30/2020   LDLCALC 86 08/30/2020   TRIG 88 08/30/2020      Wt Readings from Last 3 Encounters:  08/29/21 212 lb 6  oz (96.3 kg)  01/30/21 229 lb 15 oz (104.3 kg)  01/23/21 229 lb 15 oz (104.3 kg)      ASSESSMENT AND PLAN:   Essential HTN-  We have refilled the amlodipine valsartan 10/320 mg daily Could consider Cardura/doxazosin if blood pressure runs high  Benign prostatic hyperplasia, unspecified whether lower urinary tract symptoms present Managed by urology He is requesting testosterone level today, labs ordered  GERD Reports symptoms stable Takes vinegar  Discussed CT coronary calcium score He will  call if interested   Total encounter time more than 30 minutes  Greater than 50% was spent in counseling and coordination of care with the patient   No orders of the defined types were placed in this encounter.    Signed, Esmond Plants, M.D., Ph.D. 08/29/2021  [place], [place]

## 2021-08-29 ENCOUNTER — Other Ambulatory Visit: Payer: Self-pay

## 2021-08-29 ENCOUNTER — Ambulatory Visit (INDEPENDENT_AMBULATORY_CARE_PROVIDER_SITE_OTHER): Payer: Managed Care, Other (non HMO) | Admitting: Cardiovascular Disease

## 2021-08-29 ENCOUNTER — Encounter: Payer: Self-pay | Admitting: Cardiovascular Disease

## 2021-08-29 VITALS — BP 150/60 | HR 64 | Ht 73.0 in | Wt 212.4 lb

## 2021-08-29 DIAGNOSIS — I10 Essential (primary) hypertension: Secondary | ICD-10-CM

## 2021-08-29 DIAGNOSIS — N401 Enlarged prostate with lower urinary tract symptoms: Secondary | ICD-10-CM

## 2021-08-29 DIAGNOSIS — N5201 Erectile dysfunction due to arterial insufficiency: Secondary | ICD-10-CM

## 2021-08-29 DIAGNOSIS — K219 Gastro-esophageal reflux disease without esophagitis: Secondary | ICD-10-CM | POA: Diagnosis not present

## 2021-08-29 MED ORDER — AMLODIPINE BESYLATE-VALSARTAN 10-320 MG PO TABS: 1.0000 | ORAL_TABLET | Freq: Every day | ORAL | 3 refills | Status: DC

## 2021-08-29 NOTE — Patient Instructions (Addendum)
Medication Instructions:  No changes  If you need a refill on your cardiac medications before your next appointment, please call your pharmacy.   Lab work: - Your physician recommends that you have lab work today: Free testosterone  Testing/Procedures: No new testing needed  Follow-Up: At [facility], you and your health needs are our priority.  As part of our continuing mission to provide you with exceptional heart care, we have created designated Provider Care Teams.  These Care Teams include your primary Cardiologist (physician) and Advanced Practice Providers (APPs -  Physician Assistants and Nurse Practitioners) who all work together to provide you with the care you need, when you need it.  You will need a follow up appointment in 12 months  Providers on your designated Care Team:   Murray Hodgkins, NP Christell Faith, PA-C Cadence Kathlen Mody, [place]  COVID-19 Vaccine Information can be found at: [email or web address] For questions related to vaccine distribution or appointments, please email [email or web address] or call [phone].

## 2021-09-04 ENCOUNTER — Encounter: Payer: Self-pay | Admitting: Cardiovascular Disease

## 2021-09-04 LAB — TESTOSTERONE, FREE: Testosterone, Free: 9.8 pg/mL (ref 6.6–18.1)

## 2021-09-06 ENCOUNTER — Other Ambulatory Visit: Payer: Self-pay

## 2021-09-06 DIAGNOSIS — I10 Essential (primary) hypertension: Secondary | ICD-10-CM

## 2021-09-06 DIAGNOSIS — Z8342 Family history of familial hypercholesterolemia: Secondary | ICD-10-CM

## 2021-09-06 DIAGNOSIS — Z13220 Encounter for screening for lipoid disorders: Secondary | ICD-10-CM

## 2021-09-20 ENCOUNTER — Ambulatory Visit
Admission: RE | Admit: 2021-09-20 | Discharge: 2021-09-20 | Disposition: A | Discharge status: Home / Self Care | Payer: Managed Care, Other (non HMO) | Source: Ambulatory Visit | Attending: Cardiovascular Disease | Admitting: Cardiovascular Disease

## 2021-09-20 ENCOUNTER — Other Ambulatory Visit: Payer: Self-pay

## 2021-09-20 DIAGNOSIS — I10 Essential (primary) hypertension: Secondary | ICD-10-CM | POA: Insufficient documentation

## 2021-09-20 DIAGNOSIS — Z13220 Encounter for screening for lipoid disorders: Secondary | ICD-10-CM | POA: Insufficient documentation

## 2021-09-20 DIAGNOSIS — Z8342 Family history of familial hypercholesterolemia: Secondary | ICD-10-CM | POA: Insufficient documentation

## 2021-10-07 ENCOUNTER — Other Ambulatory Visit: Payer: Self-pay

## 2021-10-14 ENCOUNTER — Other Ambulatory Visit: Payer: Self-pay

## 2021-10-14 DIAGNOSIS — R972 Elevated prostate specific antigen [PSA]: Secondary | ICD-10-CM

## 2021-10-17 ENCOUNTER — Other Ambulatory Visit: Payer: Managed Care, Other (non HMO)

## 2021-10-17 DIAGNOSIS — R972 Elevated prostate specific antigen [PSA]: Secondary | ICD-10-CM

## 2021-10-18 LAB — PSA: Prostate Specific Ag, Serum: 1.8 ng/mL (ref 0.0–4.0)

## 2021-10-19 ENCOUNTER — Ambulatory Visit (INDEPENDENT_AMBULATORY_CARE_PROVIDER_SITE_OTHER): Payer: Managed Care, Other (non HMO) | Admitting: Urology

## 2021-10-19 ENCOUNTER — Encounter: Payer: Self-pay | Admitting: Urology

## 2021-10-19 VITALS — BP 130/73 | HR 68 | Ht 70.0 in | Wt 210.0 lb

## 2021-10-19 DIAGNOSIS — N401 Enlarged prostate with lower urinary tract symptoms: Secondary | ICD-10-CM | POA: Diagnosis not present

## 2021-10-19 DIAGNOSIS — Z87898 Personal history of other specified conditions: Secondary | ICD-10-CM

## 2021-10-19 DIAGNOSIS — R972 Elevated prostate specific antigen [PSA]: Secondary | ICD-10-CM | POA: Diagnosis not present

## 2021-10-19 NOTE — Progress Notes (Signed)
? ?  10/19/2021  ?11:11 AM  ? ?Reginald Mckee ?1955-08-18 ?[id number] ? ?Referring provider: No referring provider defined for this encounter. ?Chief Complaint  ?Patient presents with  ? Elevated PSA  ? ? ? ?Urologic history: ?1.  Elevated PSA ?-PSA low to mid 4s; has elected surveillance ?-MRI 12/19; 84 cc gland; no indeterminant/suspicious lesions ?  ?2.  BPH with lower urinary tract symptoms ?- PAE at [facility] 2022 ? ?HPI: ?Reginald Mckee is a 66 y.o. male for annual follow up of BPH and an elevated PSA ? ?-Elected to proceed with PAE at [facility].  Initially performed in August however did not see significant improvement.  Had repeat 4 months later with improvement in his voiding symptoms ?-PSA 10/17/2021 significantly decreased 1.8 ? ? ?PMH: ?Past Medical History:  ?Diagnosis Date  ? Acid reflux   ? Arthritis   ? ? ?Surgical History: ?Past Surgical History:  ?Procedure Laterality Date  ? ELBOW SURGERY Right   ? HERNIA REPAIR    ? ? ?Home Medications:  ?Allergies as of 10/19/2021   ? ?   Reactions  ? Cephalexin   ? ?  ? ?  ?Medication List  ?  ? ?  ? Accurate as of October 19, 2021 11:11 AM. If you have any questions, ask your nurse or doctor.  ?  ?  ? ?  ? ?STOP taking these medications   ? ?silodosin 8 MG Caps capsule ?Commonly known as: RAPAFLO ?Stopped by: [name], MD ?  ? ?  ? ?TAKE these medications   ? ?amLODipine-valsartan 10-320 MG tablet ?Commonly known as: EXFORGE ?Take 1 tablet by mouth daily. ?  ? ?  ? ? ?Allergies: ?Allergies  ?Allergen Reactions  ? Cephalexin   ? ? ?Family History: ?Family History  ?Problem Relation Age of Onset  ? Atrial fibrillation Father   ? Prostate cancer Neg Hx   ? Bladder Cancer Neg Hx   ? Kidney cancer Neg Hx   ? ? ?Social History:  ? reports that he has never smoked. He has quit using smokeless tobacco. He reports current alcohol use. He reports that he does not use drugs. ? ?ROS: ?Pertinent ROS in HPI. ? ?Physical Exam: ?BP 130/73   Pulse 68   Ht '5\' 10"'$  (1.778 m)   Wt 210 lb  (95.3 kg)   BMI 30.13 kg/m?   ?Constitutional:  Alert and oriented, No acute distress. ?HEENT: [place] AT, moist mucus membranes.  Trachea midline, no masses. ?Cardiovascular: No clubbing, cyanosis, or edema. ?Respiratory: Normal respiratory effort, no increased work of breathing. ?Neurologic: Grossly intact, no focal deficits, moving all 4 extremities. ?Psychiatric: Normal mood and affect. ? ?Laboratory Data: ? ?Results for orders placed or performed in visit on 10/17/21  ?PSA  ?Result Value Ref Range  ? Prostate Specific Ag, Serum 1.8 0.0 - 4.0 ng/mL  ? ? ?Assessment & Plan:   ? ?1. BPH with LUTS ?Status post PAE x2 with improvement ? ?2.  History elevated PSA ?Significant decrease PSA to 1.8 which most likely reflects PAE ? ?He wanted to hold off on scheduling a follow-up visit until he gets Medicare and stated he would call back to schedule a follow-up ? ? ? ?[name], MD ? ? ? ?[place] ?[address] ?[place], [place] [address] ?[phone]  ?

## 2021-10-21 ENCOUNTER — Encounter: Payer: Self-pay | Admitting: Urology

## 2021-10-23 ENCOUNTER — Encounter: Payer: Self-pay | Admitting: Urology

## 2022-04-11 IMAGING — CT CT CARDIAC CORONARY ARTERY CALCIUM SCORE
3 series · 14 of 20 positions shown, 16 images · non-contrast
Comparison: None.
COMPARISON: None.

Addendum:
EXAM:
OVER-READ INTERPRETATION  CT CHEST

The following report is an over-read performed by radiologist Dr.
Ja Bu Cbd [REDACTED] on 09/20/2021. This
over-read does not include interpretation of cardiac or coronary
anatomy or pathology. The coronary calcium score interpretation by
the cardiologist is attached.
CLINICAL DATA: Risk stratification
Coronary Calcium Score
TECHNIQUE: The patient was scanned on a Siemens Somatom go.Top Scanner. Axial
non-contrast 3 mm slices were carried out through the heart. The
data set was analyzed on a dedicated work station and scored using
the Agatson method.

[Series 2: sa36 calcium scoring 3.00 · axial · 0.44mm/px · z∈[-1120,-1039]mm · 4 of 46 slices shown]
[im 10/46  vessel]
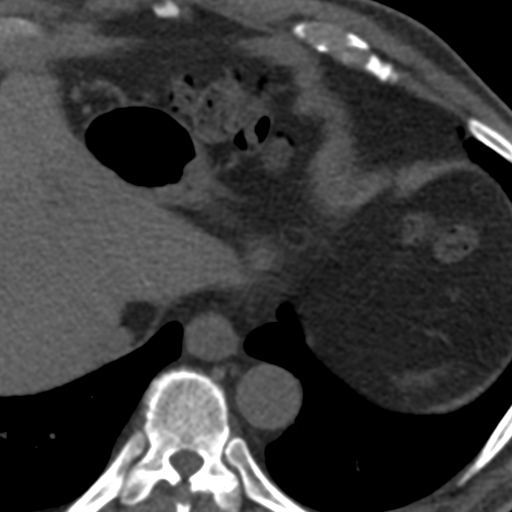
[im 19/46  vessel]
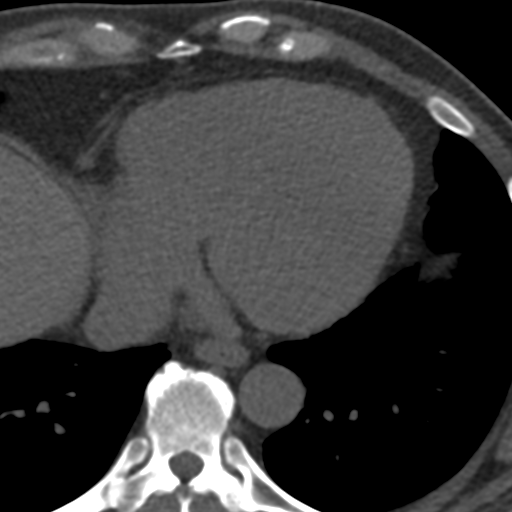
[im 28/46  vessel]
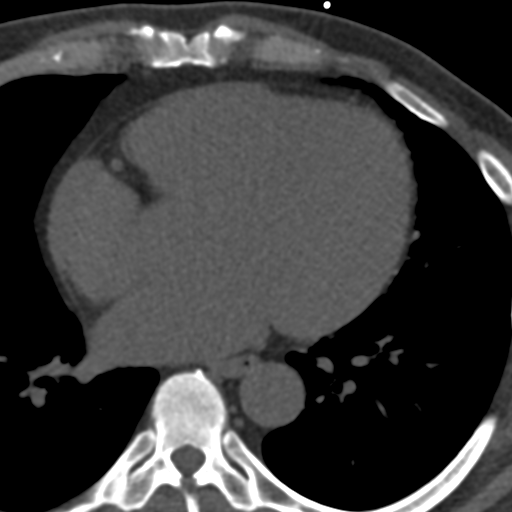
[im 37/46  vessel]
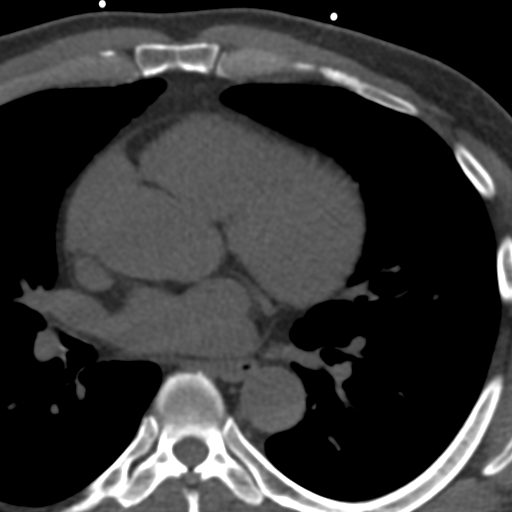

[Series 5: full fov st calcium scoring 3.00 · axial · 0.72mm/px · z∈[-1126,-1036]mm · 5 of 46 slices shown, 7 images]
[im 8/46  vessel]
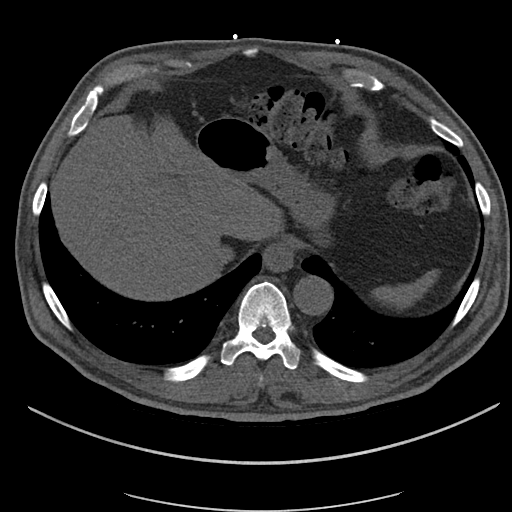
[im 8/46  lung]
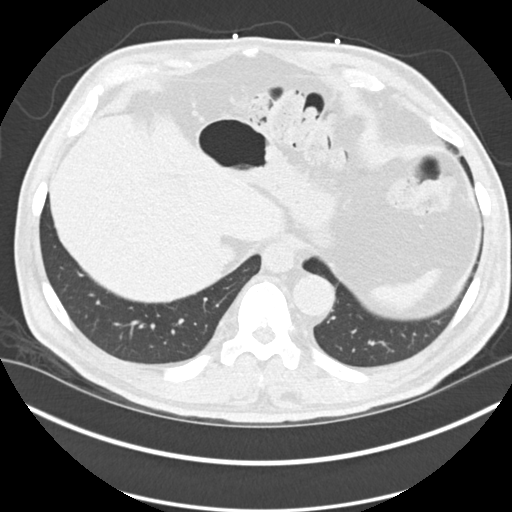
[im 16/46  vessel]
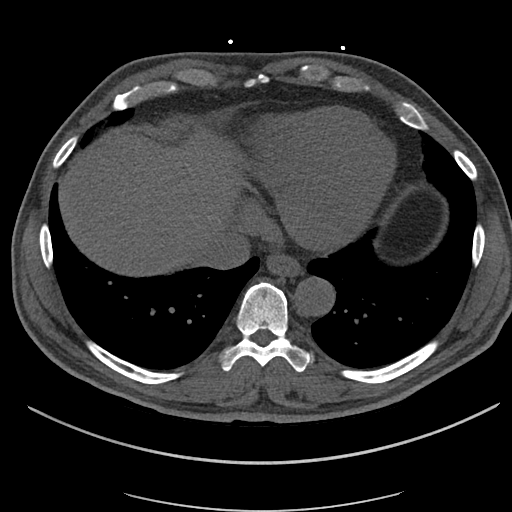
[im 23/46  vessel]
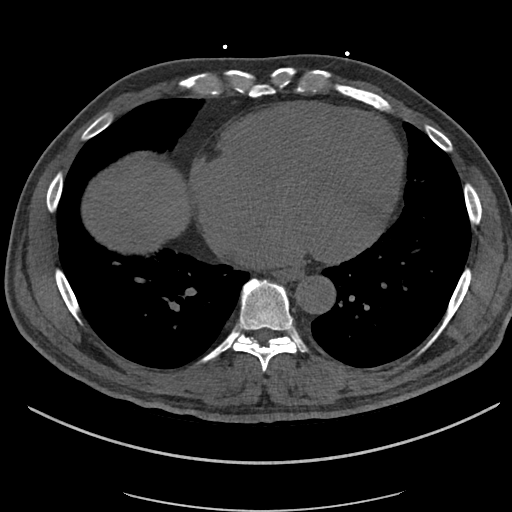
[im 31/46  vessel]
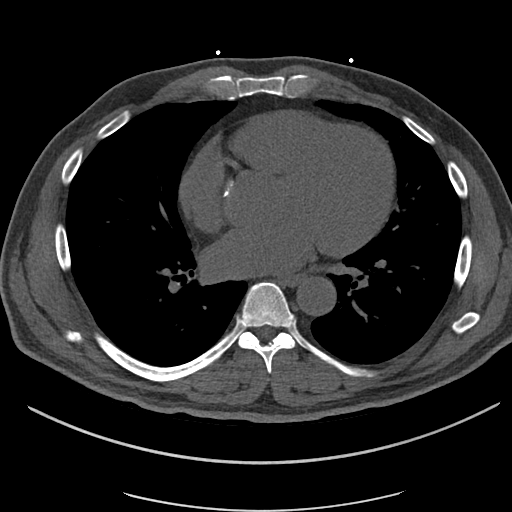
[im 38/46  vessel]
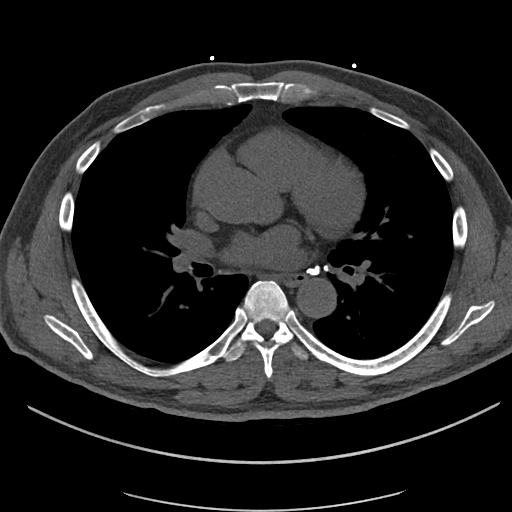
[im 38/46  lung]
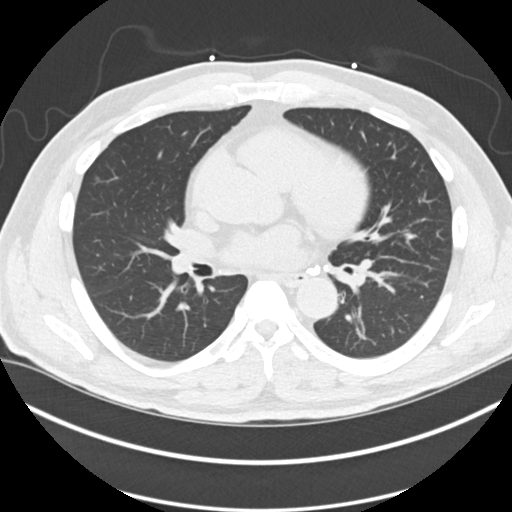

[Series 10: full fov lungs calcium scoring 3.00 ax · axial · 0.72mm/px · z∈[-1126,-1036]mm · 5 of 46 slices shown]
[im 8/46  vessel]
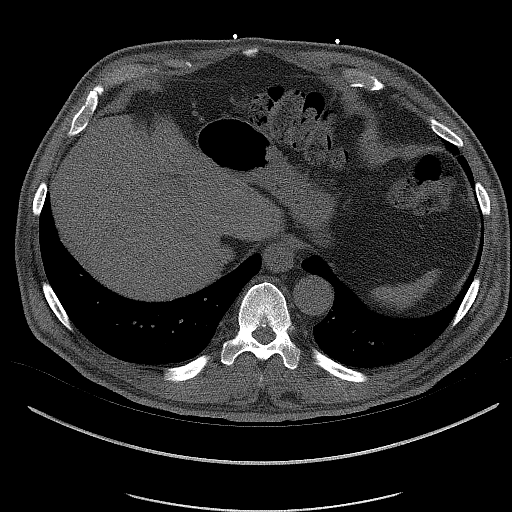
[im 16/46  vessel]
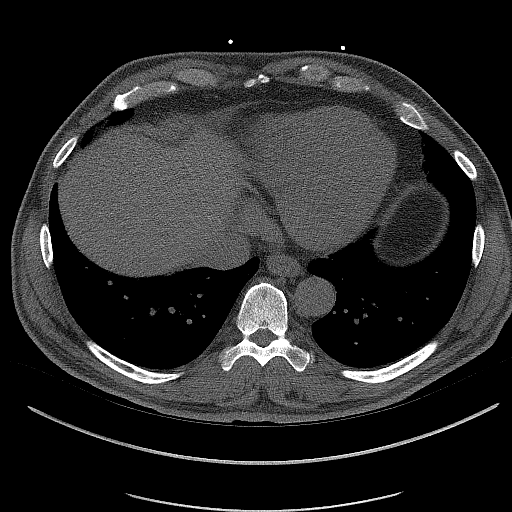
[im 23/46  vessel]
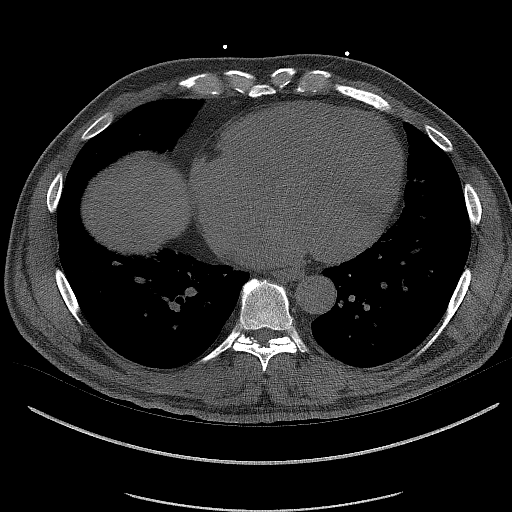
[im 31/46  vessel]
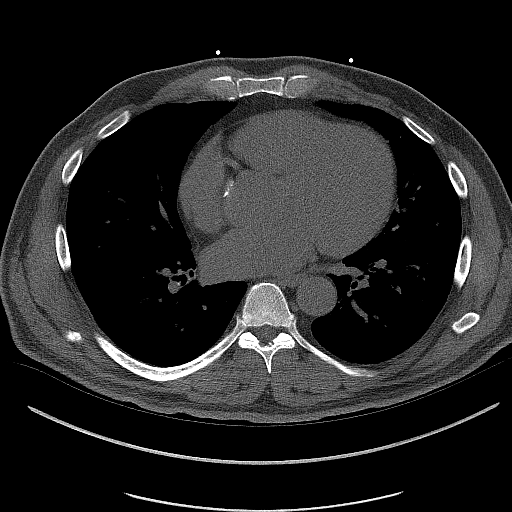
[im 38/46  vessel]
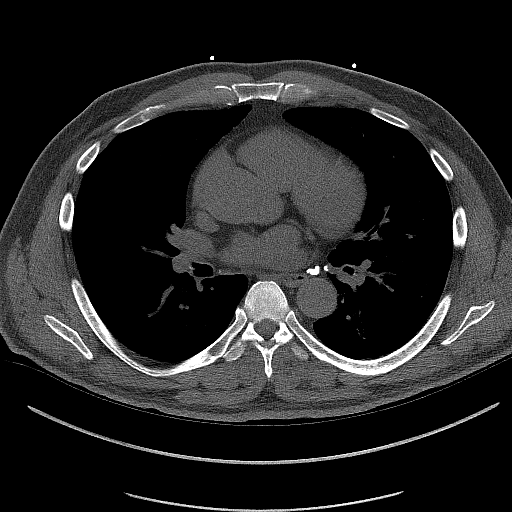

[14 of 20 positions shown; findings below may reference images not displayed]

FINDINGS: Vascular: Dilated ascending thoracic aorta measures approximately
4.2 cm in greatest diameter. Mildly dilated/top-normal central
pulmonary artery caliber with the main pulmonary artery measuring
approximately 3.1 cm.

Mediastinum/Nodes: Calcified subcarinal and left hilar lymph nodes
are consistent with prior granulomatous disease.

Lungs/Pleura: Visualized lungs show no evidence of pulmonary edema,
consolidation, pneumothorax, nodule or pleural fluid.

Upper Abdomen: Tiny low-density area at the posterior dome of the
liver measures approximately 6 mm and may represent a small cyst.

Musculoskeletal: No chest wall mass or suspicious bone lesions
identified.
IMPRESSION: 1. Aneurysmal dilatation of the ascending thoracic aorta measuring
up to approximately 4.2 cm. Recommend annual imaging followup by CTA
or MRA. This recommendation follows 5212
ACCF/AHA/AATS/ACR/ASA/SCA/MOOLMAN/BOJANNA/JIM/DEEQA RAYAAN Guidelines for the
Diagnosis and Management of Patients with Thoracic Aortic Disease.
Circulation. 5212; 121: E266-e369. Aortic aneurysm NOS (ETZLO-EIT.5)
2. Mildly dilated main pulmonary artery may be consistent with some
degree of underlying pulmonary hypertension.
3. Prior granulomatous disease with calcified subcarinal and left
hilar lymph nodes.
4. Probable tiny cyst in the visualized upper liver measuring
approximately 6 mm.
FINDINGS: Non-cardiac: See separate report from [REDACTED].

Ascending Aorta: Normal size

Pericardium: Normal

Coronary arteries: Normal origin of left and right coronary
arteries. Distribution of arterial calcifications if present, as
noted below;

LM

LAD 0

LCx 0

RCA 0

Total

IMPRESSION AND RECOMMENDATION:
1. Coronary calcium score of 53.1. This was 50th percentile for age
and sex matched control.

2. CAC 1-99 in LM. SAIDIALLY PERECTIONIST1/N1.

3. Continue heart healthy lifestyle and risk factor modification.

*** End of Addendum ***
EXAM:
OVER-READ INTERPRETATION  CT CHEST

The following report is an over-read performed by radiologist Dr.
Ja Bu Cbd [REDACTED] on 09/20/2021. This
over-read does not include interpretation of cardiac or coronary
anatomy or pathology. The coronary calcium score interpretation by
the cardiologist is attached.
FINDINGS: Vascular: Dilated ascending thoracic aorta measures approximately
4.2 cm in greatest diameter. Mildly dilated/top-normal central
pulmonary artery caliber with the main pulmonary artery measuring
approximately 3.1 cm.

Mediastinum/Nodes: Calcified subcarinal and left hilar lymph nodes
are consistent with prior granulomatous disease.

Lungs/Pleura: Visualized lungs show no evidence of pulmonary edema,
consolidation, pneumothorax, nodule or pleural fluid.

Upper Abdomen: Tiny low-density area at the posterior dome of the
liver measures approximately 6 mm and may represent a small cyst.

Musculoskeletal: No chest wall mass or suspicious bone lesions
identified.
IMPRESSION: 1. Aneurysmal dilatation of the ascending thoracic aorta measuring
up to approximately 4.2 cm. Recommend annual imaging followup by CTA
or MRA. This recommendation follows 5212
ACCF/AHA/AATS/ACR/ASA/SCA/MOOLMAN/BOJANNA/JIM/DEEQA RAYAAN Guidelines for the
Diagnosis and Management of Patients with Thoracic Aortic Disease.
Circulation. 5212; 121: E266-e369. Aortic aneurysm NOS (ETZLO-EIT.5)
2. Mildly dilated main pulmonary artery may be consistent with some
degree of underlying pulmonary hypertension.
3. Prior granulomatous disease with calcified subcarinal and left
hilar lymph nodes.
4. Probable tiny cyst in the visualized upper liver measuring
approximately 6 mm.

## 2023-06-04 DIAGNOSIS — M7022 Olecranon bursitis, left elbow: Secondary | ICD-10-CM | POA: Diagnosis not present

## 2023-06-19 DIAGNOSIS — M7022 Olecranon bursitis, left elbow: Secondary | ICD-10-CM | POA: Diagnosis not present

## 2023-07-03 ENCOUNTER — Telehealth: Payer: Self-pay | Admitting: Cardiovascular Disease

## 2023-07-03 NOTE — Telephone Encounter (Signed)
 Patient c/o Palpitations:  STAT if patient reporting lightheadedness, shortness of breath, or chest pain  How long have you had palpitations/irregular HR/ Afib? Are you having the symptoms now?  Irregular heartbeat  Are you currently experiencing lightheadedness, SOB or CP? Short of breath, hard to get enough air  Do you have a history of afib (atrial fibrillation) or irregular heart rhythm?   Have you checked your BP or HR? (document readings if available): 217/99 yesterday- have not checked it today  Are you experiencing any other symptoms? low energy- patient wants to be seen

## 2023-07-03 NOTE — Telephone Encounter (Signed)
 Spoke with patient and he stated that he was having chest tightness for the few days. Patient described signs and symptoms experiencing as heart fluttering. Patient stated that the fluttering episodes don't last very long but they make him nervous as he has family members diagnosed with afib. Asked patient about shortness of breath he stated that he feels like he can't get a deep breath during these episodes but doesn't have shortness of breath ordinarily. Patient stated that his anxiety makes his BP increase and for his peace of mind he would like to be evaluated sooner rather than later. Patient denies chest pain or discomfort that spreads to the jaw, neck, back, shoulders, arms, or upper belly, shortness of breath at rest, cold sweats, feeling light-headed or dizzy, nausea and vomiting. Patient give emergency department presenting protocols that he read back. Patient scheduled for appointment Friday 07/13/23 at 2:45.

## 2023-07-13 ENCOUNTER — Encounter: Payer: Self-pay | Admitting: Cardiovascular Disease

## 2023-07-13 ENCOUNTER — Ambulatory Visit: Payer: Self-pay | Admitting: Medical

## 2023-07-13 ENCOUNTER — Ambulatory Visit: Payer: Medicare HMO | Attending: Cardiovascular Disease | Admitting: Cardiovascular Disease

## 2023-07-13 ENCOUNTER — Ambulatory Visit: Payer: Medicare HMO

## 2023-07-13 VITALS — BP 150/60 | HR 66 | Ht 73.0 in | Wt 226.0 lb

## 2023-07-13 DIAGNOSIS — R002 Palpitations: Secondary | ICD-10-CM

## 2023-07-13 DIAGNOSIS — I10 Essential (primary) hypertension: Secondary | ICD-10-CM | POA: Diagnosis not present

## 2023-07-13 MED ORDER — AMLODIPINE BESYLATE-VALSARTAN 10-320 MG PO TABS: 1.0000 | ORAL_TABLET | Freq: Every day | ORAL | 4 refills | Status: DC

## 2023-07-13 NOTE — Progress Notes (Signed)
 Cardiology Office Note  Date:  07/13/2023   ID:  Reginald Mckee, DOB December 19, 1955, MRN [id number]  PCP:  System, Provider Not In  Urology:[facility]  Chief Complaint  Patient presents with   Palpitations    Patient c/o having palpitations, @ home BP of 217/99 with chest tightness/pressure last week. Patient notice fluttering in heart when lying down to sleep.      HPI:  Reginald Mckee Is a 69 year old gentleman with past medical history of BPH, Elevated PSA Hypertension Remote social smoker Chronic back problem GERD History of leg swelling Calcium score 53 in march 2023 Presents for f/u of his hypertension, palpitations/fluttering  LOV 2/23 In follow-up today reports that he has been off his blood pressure medication, restarted in the past week Appreciated irregular heart rhythm, short lasting episode 1 week ago Checked his blood pressure, was nervous, systolic pressure over 200 at the time And restarted his blood pressure pill  He did stop his blood pressure pill over the summer as he was getting orthostasis symptoms  Retired 16 mo ago Spends time particularly in the warmer weather 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 [place]   Reports losing weight, then gaining some weight back  Prior history of prostate ablation, with redo  EKG personally reviewed by myself on todays visit EKG Interpretation Date/Time:  Friday July 13 2023 09:49:36 EST Ventricular Rate:  66 PR Interval:  140 QRS Duration:  114 QT Interval:  428 QTC Calculation: 448 R Axis:   -8  Text Interpretation: Normal sinus rhythm Left ventricular hypertrophy with repolarization abnormality ( R in aVL , Cornell product ) No previous ECGs available Confirmed by Perla Lye [phone]) on 07/13/2023 9:53:22 AM    PMH:   has a past medical history of Acid reflux and Arthritis.  PSH:    Past Surgical History:  Procedure Laterality Date   ELBOW SURGERY Right    HERNIA REPAIR      Current Outpatient  Medications  Medication Sig Dispense Refill   amLODipine -valsartan  (EXFORGE ) 10-320 MG tablet Take 1 tablet by mouth daily. 90 tablet 3   meloxicam (MOBIC) 15 MG tablet Take 15 mg by mouth as needed.     methocarbamol (ROBAXIN) 500 MG tablet Take 1 tablet by mouth 3 (three) times daily as needed.     No current facility-administered medications for this visit.    Allergies:   Cephalexin    Social History:  The patient  reports that he has never smoked. He has quit using smokeless tobacco. He reports current alcohol use. He reports that he does not use drugs.   Family History:   family history includes Atrial fibrillation in his father.    Review of Systems: Review of Systems  Constitutional: Negative.   HENT: Negative.    Respiratory: Negative.    Cardiovascular:  Positive for palpitations.  Gastrointestinal: Negative.   Musculoskeletal: Negative.   Neurological: Negative.   Psychiatric/Behavioral: Negative.    All other systems reviewed and are negative.   PHYSICAL EXAM: VS:  BP (!) 150/60 (BP Location: Left Arm, Patient Position: Sitting, Cuff Size: Normal)   Pulse 66   Ht 6' 1 (1.854 m)   Wt 226 lb (102.5 kg)   SpO2 97%   BMI 29.82 kg/m  , BMI Body mass index is 29.82 kg/m. Constitutional:  oriented to person, place, and time. No distress.  HENT:  Head: Grossly normal Eyes:  no discharge. No scleral icterus.  Neck: No JVD, no carotid bruits  Cardiovascular: Regular rate and rhythm,  no murmurs appreciated Pulmonary/Chest: Clear to auscultation bilaterally, no wheezes or rails Abdominal: Soft.  no distension.  no tenderness.  Musculoskeletal: Normal range of motion Neurological:  normal muscle tone. Coordination normal. No atrophy Skin: Skin warm and dry Psychiatric: normal affect, pleasant  Recent Labs: No results found for requested labs within last 365 days.    Lipid Panel Lab Results  Component Value Date   CHOL 168 08/30/2020   HDL 64 08/30/2020    LDLCALC 86 08/30/2020   TRIG 88 08/30/2020      Wt Readings from Last 3 Encounters:  07/13/23 226 lb (102.5 kg)  10/19/21 210 lb (95.3 kg)  08/29/21 212 lb 6 oz (96.3 kg)      ASSESSMENT AND PLAN:  Essential HTN-  For quite some time had stopped his blood pressure medication, not checking numbers at home Restarted blood pressure medication in the past week after high blood pressure measurement at home We have refilled the amlodipine  valsartan  10/320 mg daily Recommend he monitor blood pressure at home and call us  with numbers If he does have orthostasis symptoms and low documented blood pressure, may need to cut the pill in half over the summer  Palpitations Strong for a history of atrial fibrillation Zio monitor ordered  Benign prostatic hyperplasia, unspecified whether lower urinary tract symptoms present Managed by urology  GERD Reports symptoms stable Takes vinegar  Previously discussed CT coronary calcium score   Orders Placed This Encounter  Procedures   EKG 12-Lead     Signed, Velinda Lunger, M.D., Ph.D. 07/13/2023  [facility] [place], [place] [phone]

## 2023-07-13 NOTE — Patient Instructions (Addendum)
Medication Instructions:  No changes  If you need a refill on your cardiac medications before your next appointment, please call your pharmacy.   Lab work: No new labs needed  Testing/Procedures: Heart Monitor:  Your physician has requested you wear a ZIO monitor for 14 days.  Your monitor will be mailed to your home address within 3-5 business days. This is sent via [employer] from [employer]. However, if you have not received your monitor after 5 business days please send [place] a MyChart message or call the office at [phone], so we may follow up on this for you.   This monitor is a medical device (single patch monitor) that records the heart's electrical activity. Doctors most often use these monitors to diagnose arrhythmias. Arrhythmias are problems with the speed or rhythm of the heartbeat.   [employer] supplies 1 patch per enrollment. Additional stickers are not available.  Please DO NOT apply the patch if you will be having a Nuclear Stress Test, Echocardiogram, Cardiac CT, Cardiac MRI, Chest X-ray during the period you would be wearing the monitor. The patch cannot be worn during these tests.  You cannot remove and re-apply the ZIO patch monitor.   Applying the Monitor: Once you receive your monitor, this will include a small razor, abrader, and 4 alcohol pads. Shave hair from upper left chest Rub abrader disc in 40 strokes over the left upper chest as indicated in your monitor instructions Clean area with 4 enclosed alcohol pads (there may be a mild & brief stinging sensation over the newly abraded area, but this is normal). Let dry Apply patch as indicated in monitor instructions. Patch will be placed under collarbone on the left side of the chest with arrow pointing upward. Rub adhesive wings for 2 minutes. Remove white label marked "1". Remove the white label marked "2". Rub patch adhesive wings for an 2 minutes.  While looking in a mirror, press and release button in  the center of the patch. You may hear a "click". A small green light will flash 4-6 times and then stop. This will be your indicator that the monitor has been turned on.  Wearing the Monitor: Avoid showering during the first 24 hours of wearing the monitor.  After 24 hours you may shower with the patch on. Take brief showers with your back facing the shower head.  Avoid excessive sweating to help maximize wear time. Do not submerge the device, no hot tubs, and no swimming pools. Keep any lotions or oils away from the patch. Press the button if you feel a symptom. You will hear a small click. Record date, time, and symptoms in the Patient Logbook or App.  Monitor Issues: Call [employer] Customer Care at [phone] if you have questions regarding your Zio Patch Monitor. Call them immediately if you see an orange/ amber colored light blinking on your monitor. If your monitor falls off and you cannot get this reapplied or if you need suggestions for securing your monitor call [employer] at [phone].   Returning the Monitor: Once you have completed wearing your monitor, follow instructions on the last 2 pages of the Patient Logbook. Stick monitor patch on to the last page of the Patient Logbook.  Place Patient Logbook with monitor in the return box provided. Use locking tab on box and tape box closed securely. The return box has pre-paid postage on it.  Place the return box in the regular [place] Mail box as soon as possible It will  take anywhere from 1-2 weeks for your provider to receive and review your results once you mail this back. If for some reason you have misplaced your return box then call our office and we can provide another box and/or mail it off for you.   Billing  and Patient Assistance Program Information: We have supplied [employer] with any of your insurance information on file for billing purposes. [employer] offers a sliding scale Patient Assistance Program for patients  that do not have insurance, or whose insurance does not completely cover the cost of the ZIO monitor. You must apply for the Patient Assistance Program to qualify for this discounted rate. To apply, please call [employer] at [phone], select option 1, ask to apply for the Patient Assistance Program. [employer] will ask your household income, and how many people are in your household. They will quote your out-of-pocket cost based on that information. [employer] will also be able to set up for a 8-month, interest-free payment plan if needed.     Follow-Up: At [facility], you and your health needs are our priority.  As part of our continuing mission to provide you with exceptional heart care, we have created designated Provider Care Teams.  These Care Teams include your primary Cardiologist (physician) and Advanced Practice Providers (APPs -  Physician Assistants and Nurse Practitioners) who all work together to provide you with the care you need, when you need it.  You will need a follow up appointment in 12 months  Providers on your designated Care Team:   Nicolasa Ducking, NP Eula Listen, PA-C Cadence Fransico Michael, [place]  COVID-19 Vaccine Information can be found at: [email or web address] For questions related to vaccine distribution or appointments, please email [email or web address] or call [phone].

## 2023-08-10 DIAGNOSIS — R002 Palpitations: Secondary | ICD-10-CM | POA: Diagnosis not present

## 2023-08-19 DIAGNOSIS — R002 Palpitations: Secondary | ICD-10-CM

## 2023-08-20 ENCOUNTER — Encounter: Payer: Self-pay | Admitting: Cardiovascular Disease

## 2023-11-20 ENCOUNTER — Ambulatory Visit: Admitting: Family Medicine

## 2023-11-22 ENCOUNTER — Ambulatory Visit (INDEPENDENT_AMBULATORY_CARE_PROVIDER_SITE_OTHER): Admitting: Family Medicine

## 2023-11-22 ENCOUNTER — Encounter: Payer: Self-pay | Admitting: Family Medicine

## 2023-11-22 VITALS — BP 146/72 | HR 60 | Temp 98.0°F | Ht 73.0 in | Wt 216.6 lb

## 2023-11-22 DIAGNOSIS — R972 Elevated prostate specific antigen [PSA]: Secondary | ICD-10-CM | POA: Diagnosis not present

## 2023-11-22 DIAGNOSIS — I712 Thoracic aortic aneurysm, without rupture, unspecified: Secondary | ICD-10-CM

## 2023-11-22 DIAGNOSIS — Z1211 Encounter for screening for malignant neoplasm of colon: Secondary | ICD-10-CM | POA: Diagnosis not present

## 2023-11-22 DIAGNOSIS — I10 Essential (primary) hypertension: Secondary | ICD-10-CM

## 2023-11-22 NOTE — Progress Notes (Signed)
 Subjective:    Patient ID: Reginald Mckee, male    DOB: 1955/10/19, 68 y.o.   MRN: [id number]  HPI Patient is a 68 year old Caucasian gentleman here today to establish care.  The vast majority of our office visit was spent in discussion.  Patient states that he has a history of arthritis in his hands, his shoulders, and in his lower back.  He also states that he has a history of hypertension however he is hesitant to take medication for it because of the way the medicine makes him feel in the summertime.  He is very hesitant to take pills of any type.  He also has a history of BPH.  He states that his PSA has been as high as 8 in the past.  However he underwent prostate artery embolization x 2 [facility] and his most recent PSA was less than 2.  He denies any issues with urination at the present time.  A coincidental finding on coronary artery calcium score was a 4.2 cm thoracic aortic aneurysm.  He was unaware that he had that.  He also has a history of a horseshoe kidneys.  Patient states that he has a history of dysphagia which he attributes to GERD.  At one point he was going to see a gastroenterologist for an EGD however this did not occur due to a variety of reasons.  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  These been treated with Efudex in the past.  He occasionally does this whenever he gets a lesion on his cheeks.  Aside from his blood pressure the only other concerning symptom that he reports is chronic diarrhea.  He states this started suddenly.  He reports profuse watery discharge at times.  He states that it is still hard.  At best they are soft and squishy.  Often it is just a rush of water when he goes to the bathroom.  He denies any travel outside the country recently.  He denies any weight loss or melena or hematochezia.  He has never had a colonoscopy.  He declines any invasive testing.  He also declines any immunizations. Past Medical  History:  Diagnosis Date   Acid reflux    Arthritis    Past Surgical History:  Procedure Laterality Date   ELBOW SURGERY Right    HERNIA REPAIR     Allergies  Allergen Reactions   Cephalexin     Social History   Socioeconomic History   Marital status: Single    Spouse name: Not on file   Number of children: Not on file   Years of education: Not on file   Highest education level: Not on file  Occupational History   Not on file  Tobacco Use   Smoking status: Never   Smokeless tobacco: Former  Psychologist, educational Use   Vaping status: Never Used  Substance and Sexual Activity   Alcohol use: Yes    Comment: daily   Drug use: No   Sexual activity: Yes    Birth control/protection: None  Other Topics Concern   Not on file  Social History Narrative   Not on file   Social Drivers of Health   Financial Resource Strain: Patient Declined (11/22/2023)   Overall Financial Resource Strain (CARDIA)    Difficulty of Paying Living Expenses: Patient declined  Food Insecurity: Patient Declined (11/22/2023)   Hunger Vital Sign    Worried About Running Out of  Food in the Last Year: Patient declined    Ran Out of Food in the Last Year: Patient declined  Transportation Needs: Patient Declined (11/22/2023)   PRAPARE - Administrator, Civil Service (Medical): Patient declined    Lack of Transportation (Non-Medical): Patient declined  Physical Activity: Unknown (11/22/2023)   Exercise Vital Sign    Days of Exercise per Week: Patient declined    Minutes of Exercise per Session: Not on file  Stress: Patient Declined (11/22/2023)   [employer] of Occupational Health - Occupational Stress Questionnaire    Feeling of Stress : Patient declined  Social Connections: Unknown (11/22/2023)   Social Connection and Isolation Panel [NHANES]    Frequency of Communication with Friends and Family: Patient declined    Frequency of Social Gatherings with Friends and Family: Patient declined    Attends  Religious Services: Patient declined    Database administrator or Organizations: Patient declined    Attends Banker Meetings: Not on file    Marital Status: Divorced  Intimate Partner Violence: Not on file       Review of Systems  All other systems reviewed and are negative.      Objective:   Physical Exam Vitals reviewed.  Constitutional:      Appearance: Normal appearance. He is normal weight.  Cardiovascular:     Rate and Rhythm: Normal rate and regular rhythm.     Pulses: Normal pulses.     Heart sounds: Normal heart sounds.  Pulmonary:     Effort: Pulmonary effort is normal. No respiratory distress.     Breath sounds: Normal breath sounds. No wheezing, rhonchi or rales.  Abdominal:     General: Abdomen is flat. Bowel sounds are normal. There is no distension.     Palpations: Abdomen is soft.     Tenderness: There is no abdominal tenderness. There is no guarding.  Musculoskeletal:     Right lower leg: No edema.     Left lower leg: No edema.  Neurological:     Mental Status: He is alert.           Assessment & Plan:  Benign essential HTN - Plan: CBC with Differential/Platelet, Comprehensive metabolic panel with GFR, Lipid panel, Hemoglobin A1c  Elevated PSA - Plan: PSA  Thoracic aortic aneurysm without rupture, unspecified part (HCC) - Plan: ECHOCARDIOGRAM COMPLETE  Colon cancer screening - Plan: Cologuard Patient states that he does not want any immunizations.  We did discuss Pneumovax 23 and the shingles vaccine but he defers these at the present time.  He will allow me to check a PSA to screen for prostate cancer.  He will allow a Cologuard to screen for colon cancer.  I am concerned that the patient may have malabsorptive diarrhea versus inflammatory bowel disease given the chronicity of the diarrhea.  If Cologuard is negative and the patient declines a colonoscopy, I would recommend checking fecal fat, stool O&P, as well as fecal calprotectin.   Patient has no history of pancreatitis to suggest pancreatic insufficiency.  I will schedule the patient for an echocardiogram to monitor his thoracic aortic aneurysm.  Check CBC, CMP, lipid panel and A1c.  Recommended the patient resume his blood pressure medication to prevent extension aspect aortic aneurysm

## 2023-11-23 ENCOUNTER — Ambulatory Visit: Payer: Self-pay | Admitting: Family Medicine

## 2023-11-23 LAB — HEMOGLOBIN A1C
Hgb A1c MFr Bld: 5.6 % (ref <5.7)
Mean Plasma Glucose: 114 mg/dL
eAG (mmol/L): 6.3 mmol/L

## 2023-11-23 LAB — CBC WITH DIFFERENTIAL/PLATELET
Absolute Lymphocytes: 1312 {cells}/uL (ref 850–3900)
Absolute Monocytes: 470 {cells}/uL (ref 200–950)
Basophils Absolute: 32 {cells}/uL (ref 0–200)
Basophils Relative: 0.6 %
Eosinophils Absolute: 70 {cells}/uL (ref 15–500)
Eosinophils Relative: 1.3 %
HCT: 47 % (ref 38.5–50.0)
Hemoglobin: 15.8 g/dL (ref 13.2–17.1)
MCH: 30.5 pg (ref 27.0–33.0)
MCHC: 33.6 g/dL (ref 32.0–36.0)
MCV: 90.7 fL (ref 80.0–100.0)
MPV: 10.9 fL (ref 7.5–12.5)
Monocytes Relative: 8.7 %
Neutro Abs: 3515 {cells}/uL (ref 1500–7800)
Neutrophils Relative %: 65.1 %
Platelets: 198 10*3/uL (ref 140–400)
RBC: 5.18 10*6/uL (ref 4.20–5.80)
RDW: 12.7 % (ref 11.0–15.0)
Total Lymphocyte: 24.3 %
WBC: 5.4 10*3/uL (ref 3.8–10.8)

## 2023-11-23 LAB — COMPREHENSIVE METABOLIC PANEL WITH GFR
AG Ratio: 2.4 (calc) (ref 1.0–2.5)
ALT: 10 U/L (ref 9–46)
AST: 13 U/L (ref 10–35)
Albumin: 4.6 g/dL (ref 3.6–5.1)
Alkaline phosphatase (APISO): 48 U/L (ref 35–144)
BUN: 14 mg/dL (ref 7–25)
CO2: 28 mmol/L (ref 20–32)
Calcium: 9.4 mg/dL (ref 8.6–10.3)
Chloride: 103 mmol/L (ref 98–110)
Creat: 0.94 mg/dL (ref 0.70–1.35)
Globulin: 1.9 g/dL (ref 1.9–3.7)
Glucose, Bld: 95 mg/dL (ref 65–99)
Potassium: 5 mmol/L (ref 3.5–5.3)
Sodium: 138 mmol/L (ref 135–146)
Total Bilirubin: 0.8 mg/dL (ref 0.2–1.2)
Total Protein: 6.5 g/dL (ref 6.1–8.1)
eGFR: 90 mL/min/{1.73_m2} (ref >=60)

## 2023-11-23 LAB — LIPID PANEL
Cholesterol: 172 mg/dL (ref <200)
HDL: 64 mg/dL (ref >=40)
LDL Cholesterol (Calc): 89 mg/dL
Non-HDL Cholesterol (Calc): 108 mg/dL (ref <130)
Total CHOL/HDL Ratio: 2.7 (calc) (ref <5.0)
Triglycerides: 91 mg/dL (ref <150)

## 2023-11-23 LAB — PSA: PSA: 2.67 ng/mL (ref <=4.00)

## 2023-12-03 DIAGNOSIS — Z1211 Encounter for screening for malignant neoplasm of colon: Secondary | ICD-10-CM | POA: Diagnosis not present

## 2023-12-06 ENCOUNTER — Ambulatory Visit

## 2023-12-06 VITALS — BP 142/60 | HR 61

## 2023-12-06 DIAGNOSIS — I10 Essential (primary) hypertension: Secondary | ICD-10-CM

## 2023-12-06 NOTE — Progress Notes (Signed)
 Patient is in office today for a nurse visit for Blood Pressure Check. Patient blood pressure was 142/60, Patient No chest pain Pt here for recheck of BP. Pt brought cuff from home for comparison. No complaints.

## 2023-12-12 LAB — COLOGUARD: COLOGUARD: NEGATIVE

## 2024-01-09 ENCOUNTER — Ambulatory Visit (HOSPITAL_COMMUNITY)
Admission: RE | Admit: 2024-01-09 | Discharge: 2024-01-09 | Disposition: A | Discharge status: Home / Self Care | Source: Ambulatory Visit | Attending: Cardiovascular Disease | Admitting: Cardiovascular Disease

## 2024-01-09 DIAGNOSIS — I712 Thoracic aortic aneurysm, without rupture, unspecified: Secondary | ICD-10-CM | POA: Insufficient documentation

## 2024-01-09 LAB — ECHOCARDIOGRAM COMPLETE
Area-P 1/2: 3.89 cm2
P 1/2 time: 355 ms
S' Lateral: 4.1 cm

## 2024-01-10 ENCOUNTER — Encounter: Payer: Self-pay | Admitting: Cardiovascular Disease

## 2024-01-11 ENCOUNTER — Telehealth: Payer: Self-pay

## 2024-01-11 ENCOUNTER — Telehealth: Payer: Self-pay | Admitting: Cardiovascular Disease

## 2024-01-11 NOTE — Telephone Encounter (Signed)
 Please make sure there is a copy of the EKG done by the PCP in his record for his visit.

## 2024-01-11 NOTE — Telephone Encounter (Signed)
 Patient says he had an EKG with PCP on 7/09. PCP had concerns and suggested following up with cardiology. Patient scheduled an appointment for 7/15 with [facility], NP--FYI.

## 2024-01-11 NOTE — Telephone Encounter (Signed)
 Copied from CRM [id number]. Topic: Clinical - Lab/Test Results >> Jan 11, 2024 10:38 AM Charlet HERO wrote: Reason for CRM: [facility] with [facility] disregard the previous notes that there would need to be a copy of EKG sent because the patient did not have one he had echiocardiogram, there has been an appt scheduled for 07/15

## 2024-01-11 NOTE — Telephone Encounter (Signed)
 Copied from CRM [id number]. Topic: Clinical - Medical Advice >> Jan 11, 2024 10:19 AM Montie POUR wrote: Reason for CRM:  [facility] needs the results from is EKG on 01/09/24. Chart shows he had a Echocardiogram on 01/09/24. Please call [facility] at [phone] before 11:30 to discuss what results they need.

## 2024-01-15 ENCOUNTER — Ambulatory Visit: Attending: Cardiology | Admitting: Cardiology

## 2024-01-15 ENCOUNTER — Encounter: Payer: Self-pay | Admitting: Cardiology

## 2024-01-15 VITALS — BP 158/64 | HR 64 | Ht 73.0 in | Wt 220.0 lb

## 2024-01-15 DIAGNOSIS — I712 Thoracic aortic aneurysm, without rupture, unspecified: Secondary | ICD-10-CM

## 2024-01-15 DIAGNOSIS — R002 Palpitations: Secondary | ICD-10-CM

## 2024-01-15 DIAGNOSIS — I77810 Thoracic aortic ectasia: Secondary | ICD-10-CM

## 2024-01-15 DIAGNOSIS — I351 Nonrheumatic aortic (valve) insufficiency: Secondary | ICD-10-CM

## 2024-01-15 DIAGNOSIS — I10 Essential (primary) hypertension: Secondary | ICD-10-CM | POA: Diagnosis not present

## 2024-01-15 NOTE — H&P (View-Only) (Signed)
 Cardiology Office Note   Date:  01/15/2024  ID:  Carlee Tesfaye, DOB 1955/10/20, MRN [id number] PCP: [name], MD  [facility] Providers Cardiologist:  Evalene Lunger, MD Cardiology APP:  Gerard Frederick, NP     History of Present Illness Reginald Mckee is a 68 y.o. male with a past medical history of BPH, hypertension, remote smoker, chronic back pain, GERD, peripheral edema, palpitations, who presents today for follow-up.   Previously, had coronary CTA completed that revealed coronary calcium score of 53.1.  50% for age and sex matched controls.  Recommend to continue with heart healthy lifestyle and risk factor modification at that time.  Your ZIO monitor which revealed sinus rhythm with rare episodes of tachycardia lasting several seconds at a time on average 3 a day, rare extra beats noted and no atrial fibrillation noted.   He was last seen in clinic 07/13/2023 at that time was having complaints of palpitations and elevated blood pressure with chest tightness in the last week.  The patient stated that he noticed fluttering in his heart when lying down to sleep.  He did stop his blood pressure pills over the summer as he was getting orthostatic symptoms.  Blood pressure was noted to be elevated at 150s systolic.  He was restarted on his amlodipine  valsartan  10/320 mg daily.  He was placed on a ZIO monitor.  No further testing or medication changes were made.  He returns to clinic today   ROS: 10 point review of systems has been reviewed and considered negative with exception ones were listed in the HPI  Studies Reviewed EKG Interpretation Date/Time:  Tuesday January 15 2024 08:23:19 EDT Ventricular Rate:  64 PR Interval:  142 QRS Duration:  104 QT Interval:  374 QTC Calculation: 385 R Axis:   -5  Text Interpretation: Normal sinus rhythm Minimal voltage criteria for LVH, may be normal variant ( Cornell product ) Early repolarization T wave inversion in lateral leads  When compared with ECG of 13-Jul-2023 09:49, No significant change since last tracing Confirmed by Gerard Frederick ([id number]) on 01/15/2024 8:31:12 AM    2D echo 01/09/2024 1. Left ventricular ejection fraction, by estimation, is 50 to 55%. Left  ventricular ejection fraction by 3D volume is 55 %. The left ventricle has  low normal function. The left ventricle has no regional wall motion  abnormalities. The left ventricular  internal cavity size was moderately dilated. There is mild concentric left  ventricular hypertrophy. Left ventricular diastolic parameters are  indeterminate. Elevated left atrial pressure. The average left ventricular  global longitudinal strain is -19.2  %. The global longitudinal strain is normal.   2. Right ventricular systolic function is normal. The right ventricular  size is normal. Tricuspid regurgitation signal is inadequate for assessing  PA pressure.   3. The mitral valve is normal in structure. Trivial mitral valve  regurgitation. No evidence of mitral stenosis.   4. The aortic valve is normal in structure. Although the AI PHT is in the moderate range, the Aortic valve regurgitation appears moderate to severe visually by colorflow. No aortic stenosis is present. Aortic regurgitation PHT measures 355 msec. AI likely related to aortic root dilatation.   5. Aortic dilatation noted. Aneurysm of the aortic root, measuring 45 mm. There is mild dilatation of the ascending aorta, measuring 42 mm.   6. Consider TEE for further evaluation of severity of AI.   Event Monitor (Zio) 08/13/2023 Normal sinus rhythm Patient had a min HR of 49  bpm, max HR of 190 bpm, and avg HR of 70 bpm.  Bundle Branch Block/IVCD was present.    1 run of Ventricular Tachycardia occurred lasting 4 beats with a max rate of 179 bpm (avg 171 bpm).    39 Supraventricular Tachycardia runs occurred, the run with the fastest interval lasting 8 beats with a max rate of 190 bpm, the longest lasting 14 beats  with an avg rate of 121 bpm.    Isolated SVEs were rare (<1.0%), SVE Couplets were rare (<1.0%), and SVE Triplets were rare (<1.0%).    Isolated VEs were rare (<1.0%, 898), VE Couplets were rare (<1.0%, 25), and VE Triplets were rare (<1.0%, 2).  Ventricular Bigeminy was present.   No patient triggered events recorded  cCTA 09/20/2021 IMPRESSION AND RECOMMENDATION: 1. Coronary calcium score of 53.1. This was 50th percentile for age and sex matched control.   2. CAC 1-99 in LM. CAC-DRS A1/N1.   3. Continue heart healthy lifestyle and risk factor modification.   Risk Assessment/Calculations   HYPERTENSION CONTROL Vitals:   01/15/24 0817 01/15/24 0830  BP: (!) 162/68 (!) 158/64    The patient's blood pressure is elevated above target today.  In order to address the patient's elevated BP: Blood pressure will be monitored at home to determine if medication changes need to be made. (medications were taken right before visit)          Physical Exam VS:  BP (!) 158/64 (BP Location: Left Arm, Patient Position: Sitting, Cuff Size: Normal)   Pulse 64   Ht 6' 1 (1.854 m)   Wt 220 lb (99.8 kg)   SpO2 97%   BMI 29.03 kg/m        Wt Readings from Last 3 Encounters:  01/15/24 220 lb (99.8 kg)  11/22/23 216 lb 9.6 oz (98.2 kg)  07/13/23 226 lb (102.5 kg)    GEN: Well nourished, well developed in no acute distress NECK: No JVD; No carotid bruits CARDIAC: RRR, II/VI systolic murmur RSB, without rubs or gallops RESPIRATORY:  Clear to auscultation without rales, wheezing or rhonchi  ABDOMEN: Soft, non-tender, non-distended EXTREMITIES:  No edema; No deformity   ASSESSMENT AND PLAN Primary hypertension with blood pressure 162/68 and the repeat pressure 158/64.  Patient just recently taken his medication prior to his appointment he is continued on amlodipine /valsartan  10-320 mg daily.  Unfortunately patient had recently taken his home off of his medications as he feels like it was not  working and is just restarted.  He has been encouraged to continue to monitor his pressure 1 to 2 hours postmedication administration at home as well.  Palpitations previously on 1 ZIO XT monitor which revealed normal sinus rhythm with rare episodes of tachycardia lasting several seconds on average every day, rare extra beats were noted and no atrial fibrillation was noted.  Patient was relieved to see he has a family history of atrial fibrillation.  EKG today reveals sinus rhythm with a rate of 64, LVH with early repolarization and T wave inversions in lateral leads with no significant change from prior studies.  Moderate to severe aortic valve regurgitation noted on recent echocardiogram ordered by PCP.  LVEF 50-55%, no RWMA, mild concentric LVH, trivial mitral regurgitation, AI PHT is in the moderate range, aortic valve regurgitation appears to be moderate to severe likely related to aortic root dilatation.  Patient is scheduled for TEE to further evaluate valve.  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 Scheduled for TEE to evaluate aortic valve, further recommendations to follow after testing is completed for next steps. Encouraged to monitor blood pressure and take medication as prescribed.     Informed Consent   Shared Decision Making/Informed Consent   The risks [esophageal damage, perforation (1:10,000 risk), bleeding, pharyngeal hematoma as well as other potential complications associated with conscious sedation including aspiration, arrhythmia, respiratory failure and death], benefits (treatment guidance and diagnostic support) and alternatives of a transesophageal echocardiogram were discussed in detail with Mr. Brentlinger and he is willing to proceed.      Dispo: Patient to return to clinic to see MD/APP in 2 to 3 weeks after his procedure has been completed or  sooner if needed  Signed, Grettell Ransdell, NP

## 2024-01-15 NOTE — Progress Notes (Signed)
 Cardiology Office Note   Date:  01/15/2024  ID:  Carlee Tesfaye, DOB 1955/10/20, MRN [id number] PCP: Reginald Mckee DASEN, MD  [facility] Providers Cardiologist:  Evalene Lunger, MD Cardiology APP:  Gerard Frederick, NP     History of Present Illness Reginald Mckee is a 68 y.o. male with a past medical history of BPH, hypertension, remote smoker, chronic back pain, GERD, peripheral edema, palpitations, who presents today for follow-up.   Previously, had coronary CTA completed that revealed coronary calcium score of 53.1.  50% for age and sex matched controls.  Recommend to continue with heart healthy lifestyle and risk factor modification at that time.  Your ZIO monitor which revealed sinus rhythm with rare episodes of tachycardia lasting several seconds at a time on average 3 a day, rare extra beats noted and no atrial fibrillation noted.   He was last seen in clinic 07/13/2023 at that time was having complaints of palpitations and elevated blood pressure with chest tightness in the last week.  The patient stated that he noticed fluttering in his heart when lying down to sleep.  He did stop his blood pressure pills over the summer as he was getting orthostatic symptoms.  Blood pressure was noted to be elevated at 150s systolic.  He was restarted on his amlodipine  valsartan  10/320 mg daily.  He was placed on a ZIO monitor.  No further testing or medication changes were made.  He returns to clinic today   ROS: 10 point review of systems has been reviewed and considered negative with exception ones were listed in the HPI  Studies Reviewed EKG Interpretation Date/Time:  Tuesday January 15 2024 08:23:19 EDT Ventricular Rate:  64 PR Interval:  142 QRS Duration:  104 QT Interval:  374 QTC Calculation: 385 R Axis:   -5  Text Interpretation: Normal sinus rhythm Minimal voltage criteria for LVH, may be normal variant ( Cornell product ) Early repolarization T wave inversion in lateral leads  When compared with ECG of 13-Jul-2023 09:49, No significant change since last tracing Confirmed by Gerard Frederick ([id number]) on 01/15/2024 8:31:12 AM    2D echo 01/09/2024 1. Left ventricular ejection fraction, by estimation, is 50 to 55%. Left  ventricular ejection fraction by 3D volume is 55 %. The left ventricle has  low normal function. The left ventricle has no regional wall motion  abnormalities. The left ventricular  internal cavity size was moderately dilated. There is mild concentric left  ventricular hypertrophy. Left ventricular diastolic parameters are  indeterminate. Elevated left atrial pressure. The average left ventricular  global longitudinal strain is -19.2  %. The global longitudinal strain is normal.   2. Right ventricular systolic function is normal. The right ventricular  size is normal. Tricuspid regurgitation signal is inadequate for assessing  PA pressure.   3. The mitral valve is normal in structure. Trivial mitral valve  regurgitation. No evidence of mitral stenosis.   4. The aortic valve is normal in structure. Although the AI PHT is in the moderate range, the Aortic valve regurgitation appears moderate to severe visually by colorflow. No aortic stenosis is present. Aortic regurgitation PHT measures 355 msec. AI likely related to aortic root dilatation.   5. Aortic dilatation noted. Aneurysm of the aortic root, measuring 45 mm. There is mild dilatation of the ascending aorta, measuring 42 mm.   6. Consider TEE for further evaluation of severity of AI.   Event Monitor (Zio) 08/13/2023 Normal sinus rhythm Patient had a min HR of 49  bpm, max HR of 190 bpm, and avg HR of 70 bpm.  Bundle Branch Block/IVCD was present.    1 run of Ventricular Tachycardia occurred lasting 4 beats with a max rate of 179 bpm (avg 171 bpm).    39 Supraventricular Tachycardia runs occurred, the run with the fastest interval lasting 8 beats with a max rate of 190 bpm, the longest lasting 14 beats  with an avg rate of 121 bpm.    Isolated SVEs were rare (<1.0%), SVE Couplets were rare (<1.0%), and SVE Triplets were rare (<1.0%).    Isolated VEs were rare (<1.0%, 898), VE Couplets were rare (<1.0%, 25), and VE Triplets were rare (<1.0%, 2).  Ventricular Bigeminy was present.   No patient triggered events recorded  cCTA 09/20/2021 IMPRESSION AND RECOMMENDATION: 1. Coronary calcium score of 53.1. This was 50th percentile for age and sex matched control.   2. CAC 1-99 in LM. CAC-DRS A1/N1.   3. Continue heart healthy lifestyle and risk factor modification.   Risk Assessment/Calculations   HYPERTENSION CONTROL Vitals:   01/15/24 0817 01/15/24 0830  BP: (!) 162/68 (!) 158/64    The patient's blood pressure is elevated above target today.  In order to address the patient's elevated BP: Blood pressure will be monitored at home to determine if medication changes need to be made. (medications were taken right before visit)          Physical Exam VS:  BP (!) 158/64 (BP Location: Left Arm, Patient Position: Sitting, Cuff Size: Normal)   Pulse 64   Ht 6' 1 (1.854 m)   Wt 220 lb (99.8 kg)   SpO2 97%   BMI 29.03 kg/m        Wt Readings from Last 3 Encounters:  01/15/24 220 lb (99.8 kg)  11/22/23 216 lb 9.6 oz (98.2 kg)  07/13/23 226 lb (102.5 kg)    GEN: Well nourished, well developed in no acute distress NECK: No JVD; No carotid bruits CARDIAC: RRR, II/VI systolic murmur RSB, without rubs or gallops RESPIRATORY:  Clear to auscultation without rales, wheezing or rhonchi  ABDOMEN: Soft, non-tender, non-distended EXTREMITIES:  No edema; No deformity   ASSESSMENT AND PLAN Primary hypertension with blood pressure 162/68 and the repeat pressure 158/64.  Patient just recently taken his medication prior to his appointment he is continued on amlodipine /valsartan  10-320 mg daily.  Unfortunately patient had recently taken his home off of his medications as he feels like it was not  working and is just restarted.  He has been encouraged to continue to monitor his pressure 1 to 2 hours postmedication administration at home as well.  Palpitations previously on 1 ZIO XT monitor which revealed normal sinus rhythm with rare episodes of tachycardia lasting several seconds on average every day, rare extra beats were noted and no atrial fibrillation was noted.  Patient was relieved to see he has a family history of atrial fibrillation.  EKG today reveals sinus rhythm with a rate of 64, LVH with early repolarization and T wave inversions in lateral leads with no significant change from prior studies.  Moderate to severe aortic valve regurgitation noted on recent echocardiogram ordered by PCP.  LVEF 50-55%, no RWMA, mild concentric LVH, trivial mitral regurgitation, AI PHT is in the moderate range, aortic valve regurgitation appears to be moderate to severe likely related to aortic root dilatation.  Patient is scheduled for TEE to further evaluate valve.  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 Scheduled for TEE to evaluate aortic valve, further recommendations to follow after testing is completed for next steps. Encouraged to monitor blood pressure and take medication as prescribed.     Informed Consent   Shared Decision Making/Informed Consent   The risks [esophageal damage, perforation (1:10,000 risk), bleeding, pharyngeal hematoma as well as other potential complications associated with conscious sedation including aspiration, arrhythmia, respiratory failure and death], benefits (treatment guidance and diagnostic support) and alternatives of a transesophageal echocardiogram were discussed in detail with Mr. Brentlinger and he is willing to proceed.      Dispo: Patient to return to clinic to see MD/APP in 2 to 3 weeks after his procedure has been completed or  sooner if needed  Signed, Grettell Ransdell, NP

## 2024-01-15 NOTE — Patient Instructions (Signed)
 Medication Instructions:  Your physician recommends that you continue on your current medications as directed. Please refer to the Current Medication list given to you today.   *If you need a refill on your cardiac medications before your next appointment, please call your pharmacy*  Lab Work: Your provider would like for you to have following labs drawn today CBC and BMP.   If you have labs (blood work) drawn today and your tests are completely normal, you will receive your results only by: MyChart Message (if you have MyChart) OR A paper copy in the mail If you have any lab test that is abnormal or we need to change your treatment, we will call you to review the results.  Testing/Procedures:     Dear Reginald Mckee  You are scheduled for a TEE (Transesophageal Echocardiogram) on Wednesday, July 23 with Dr. Gollan.  Please arrive at the [facility] Entrance of [facility], [address], [place] [address] at 11:30 AM (This is 1 hour(s) prior to your procedure time).  Proceed to the Check-In Desk directly inside the entrance.  Procedure Parking: Use the entrance off of the [address] side of the hospital. Turn right upon entering and follow the driveway to parking that is directly in front of the [facility]. There is no valet parking available at this entrance, however there is an awning directly in front of the [facility] Center for drop off/ pick up for patients.   DIET:  Nothing to eat or drink after midnight except a sip of water with medications (see medication instructions below)  MEDICATION INSTRUCTIONS: !!IF ANY NEW MEDICATIONS ARE STARTED AFTER TODAY, PLEASE NOTIFY YOUR PROVIDER AS SOON AS POSSIBLE!!   LABS: Come to [facility] in clinic 01/15/24  FYI:  For your safety, and to allow us  to monitor your vital signs accurately during the surgery/procedure we request: If you have artificial nails, gel coating, SNS etc, please have those removed prior  to your surgery/procedure. Not having the nail coverings /polish removed may result in cancellation or delay of your surgery/procedure.  Your support person will be asked to wait in the waiting room during your procedure.  It is OK to have someone drop you off and come back when you are ready to be discharged.  You cannot drive after the procedure and will need someone to drive you home.  Bring your insurance cards.  *Special Note: Every effort is made to have your procedure done on time. Occasionally there are emergencies that occur at the hospital that may cause delays. Please be patient if a delay does occur.      Follow-Up: At [facility], you and your health needs are our priority.  As part of our continuing mission to provide you with exceptional heart care, our providers are all part of one team.  This team includes your primary Cardiologist (physician) and Advanced Practice Providers or APPs (Physician Assistants and Nurse Practitioners) who all work together to provide you with the care you need, when you need it.  Your next appointment:   2 - 3 week(s) post procedure  Provider:   Evalene Lunger, MD or Tylene Lunch, NP

## 2024-01-17 ENCOUNTER — Ambulatory Visit: Payer: Self-pay | Admitting: Cardiology

## 2024-01-17 LAB — BASIC METABOLIC PANEL WITH GFR
BUN/Creatinine Ratio: 19 (ref 10–24)
BUN: 17 mg/dL (ref 8–27)
CO2: 21 mmol/L (ref 20–29)
Calcium: 9.3 mg/dL (ref 8.6–10.2)
Chloride: 102 mmol/L (ref 96–106)
Creatinine, Ser: 0.88 mg/dL (ref 0.76–1.27)
Glucose: 81 mg/dL (ref 70–99)
Potassium: 4.6 mmol/L (ref 3.5–5.2)
Sodium: 142 mmol/L (ref 134–144)
eGFR: 95 mL/min/1.73 (ref >59)

## 2024-01-17 LAB — CBC
Hematocrit: 48.2 % (ref 37.5–51.0)
Hemoglobin: 16 g/dL (ref 13.0–17.7)
MCH: 30.9 pg (ref 26.6–33.0)
MCHC: 33.2 g/dL (ref 31.5–35.7)
MCV: 93 fL (ref 79–97)
Platelets: 202 x10E3/uL (ref 150–450)
RBC: 5.18 x10E6/uL (ref 4.14–5.80)
RDW: 13.3 % (ref 11.6–15.4)
WBC: 4.8 x10E3/uL (ref 3.4–10.8)

## 2024-01-17 NOTE — Progress Notes (Signed)
 Labs have remained stable.  Continue current medication regimen without changes needed at this time.

## 2024-01-17 NOTE — H&P (View-Only) (Signed)
 Labs have remained stable.  Continue current medication regimen without changes needed at this time.

## 2024-01-21 ENCOUNTER — Encounter: Payer: Self-pay | Admitting: Family Medicine

## 2024-01-21 ENCOUNTER — Ambulatory Visit: Admitting: Family Medicine

## 2024-01-21 VITALS — BP 152/70 | HR 69 | Temp 98.6°F | Ht 73.0 in | Wt 220.0 lb

## 2024-01-21 DIAGNOSIS — I712 Thoracic aortic aneurysm, without rupture, unspecified: Secondary | ICD-10-CM

## 2024-01-21 DIAGNOSIS — I10 Essential (primary) hypertension: Secondary | ICD-10-CM

## 2024-01-21 MED ORDER — FLUOROURACIL 5 % EX CREA: TOPICAL_CREAM | Freq: Two times a day (BID) | CUTANEOUS | 0 refills | Status: AC

## 2024-01-21 NOTE — Progress Notes (Signed)
 Subjective:    Patient ID: Reginald Mckee, male    DOB: 1955-08-10, 68 y.o.   MRN: [id number]  HPI Patient is here today for follow-up.  At his last visit, I ordered an echocardiogram.  This showed stable dilatation of the aortic root at 4.5 cm.  However it showed moderate to severe aortic insufficiency.  He has seen cardiology who has recommended a TEE to evaluate further.  The patient denies any symptoms such as chest pain, syncope, near syncope.  Unfortunately his blood pressure remains very high.  He is taking his blood pressure medication.  He declines to take additional medication because he states that he feels poorly when he does and he does not want to take more medication.  His Cologuard was negative.  His lab work was excellent. Office Visit on 01/15/2024  Component Date Value Ref Range Status   WBC 01/15/2024 4.8  3.4 - 10.8 x10E3/uL Final   RBC 01/15/2024 5.18  4.14 - 5.80 x10E6/uL Final   Hemoglobin 01/15/2024 16.0  13.0 - 17.7 g/dL Final   Hematocrit 92/84/7974 48.2  37.5 - 51.0 % Final   MCV 01/15/2024 93  79 - 97 fL Final   MCH 01/15/2024 30.9  26.6 - 33.0 pg Final   MCHC 01/15/2024 33.2  31.5 - 35.7 g/dL Final   RDW 92/84/7974 13.3  11.6 - 15.4 % Final   Platelets 01/15/2024 202  150 - 450 x10E3/uL Final   Glucose 01/15/2024 81  70 - 99 mg/dL Final   BUN 92/84/7974 17  8 - 27 mg/dL Final   Creatinine, Ser 01/15/2024 0.88  0.76 - 1.27 mg/dL Final   eGFR 92/84/7974 95  >59 mL/min/1.73 Final   BUN/Creatinine Ratio 01/15/2024 19  10 - 24 Final   Sodium 01/15/2024 142  134 - 144 mmol/L Final   Potassium 01/15/2024 4.6  3.5 - 5.2 mmol/L Final   Chloride 01/15/2024 102  96 - 106 mmol/L Final   CO2 01/15/2024 21  20 - 29 mmol/L Final   Calcium 01/15/2024 9.3  8.6 - 10.2 mg/dL Final  Hospital Outpatient Visit on 01/09/2024  Component Date Value Ref Range Status   Area-P 1/2 01/09/2024 3.89  cm2 Final   S' Lateral 01/09/2024 4.10  cm Final   P 1/2 time 01/09/2024 355  msec  Final   Est EF 01/09/2024 50 - 55%   Final    Past Medical History:  Diagnosis Date   Acid reflux    Aneurysm of aorta (HCC)    Arthritis    BPH (benign prostatic hyperplasia)    Hypertension    Past Surgical History:  Procedure Laterality Date   ELBOW SURGERY Right    FINGER SURGERY Left    HERNIA REPAIR     prostate artery embolism     [facility] 2 [facility]   Allergies  Allergen Reactions   Keflex  [Cephalexin ]     Unknown Reaction    Social History   Socioeconomic History   Marital status: Single    Spouse name: Not on file   Number of children: Not on file   Years of education: Not on file   Highest education level: Not on file  Occupational History   Not on file  Tobacco Use   Smoking status: Never   Smokeless tobacco: Former  Advertising account planner   Vaping status: Never Used  Substance and Sexual Activity   Alcohol use: Yes    Comment: daily   Drug use: No  Sexual activity: Yes    Birth control/protection: None  Other Topics Concern   Not on file  Social History Narrative   Not on file   Social Drivers of Health   Financial Resource Strain: Patient Declined (11/22/2023)   Overall Financial Resource Strain (CARDIA)    Difficulty of Paying Living Expenses: Patient declined  Food Insecurity: Patient Declined (11/22/2023)   Hunger Vital Sign    Worried About Running Out of Food in the Last Year: Patient declined    Ran Out of Food in the Last Year: Patient declined  Transportation Needs: Patient Declined (11/22/2023)   PRAPARE - Administrator, Civil Service (Medical): Patient declined    Lack of Transportation (Non-Medical): Patient declined  Physical Activity: Unknown (11/22/2023)   Exercise Vital Sign    Days of Exercise per Week: Patient declined    Minutes of Exercise per Session: Not on file  Stress: Patient Declined (11/22/2023)   [employer] of Occupational Health - Occupational Stress Questionnaire    Feeling of Stress : Patient declined  Social  Connections: Unknown (11/22/2023)   Social Connection and Isolation Panel    Frequency of Communication with Friends and Family: Patient declined    Frequency of Social Gatherings with Friends and Family: Patient declined    Attends Religious Services: Patient declined    Database administrator or Organizations: Patient declined    Attends Banker Meetings: Not on file    Marital Status: Divorced  Intimate Partner Violence: Not on file       Review of Systems  All other systems reviewed and are negative.      Objective:   Physical Exam Vitals reviewed.  Constitutional:      Appearance: Normal appearance. He is normal weight.  Cardiovascular:     Rate and Rhythm: Normal rate and regular rhythm.     Pulses: Normal pulses.     Heart sounds: Normal heart sounds.  Pulmonary:     Effort: Pulmonary effort is normal.     Breath sounds: Normal breath sounds.  Neurological:     Mental Status: He is alert.           Assessment & Plan:  Thoracic aortic aneurysm without rupture, unspecified part (HCC)  Benign essential HTN The entire visit today was spent in discussion.  I am very happy with his lab work.  His Cologuard was negative.  My only concern is his blood pressure.  I recommended monitoring his aortic aneurysm annually.  I will defer to his cardiologist regarding when and if the aortic Marbert.  Maintain a systolic blood pressure less than 140.  The patient respectfully declines additional blood pressure medication at this time.

## 2024-01-22 ENCOUNTER — Telehealth: Payer: Self-pay | Admitting: *Deleted

## 2024-01-22 NOTE — Telephone Encounter (Signed)
 Called to confirm/remind patient of their procedure.   Scheduled for: TEE  [x]  Date 01/23/24  [x]  Time 12:00 pm [x]  Arrival time 11:00 am [x]  Location [facility]   [x]  Designated Driver  [x]  Instructions, time, and location reviewed with patient    [x]  H&P within 30 days  [x]  EKG within 30 days  [x]  Orders  [x]  Labs  [x]  GFR N/A  [x]  Diet  [x]  Medication instructions Reviewed   [x]  Spoke with patient and reviewed all information []  VM Full/unable to leave message  []  Phone not in service

## 2024-01-23 ENCOUNTER — Other Ambulatory Visit: Payer: Self-pay

## 2024-01-23 ENCOUNTER — Encounter: Payer: Self-pay | Admitting: Cardiovascular Disease

## 2024-01-23 ENCOUNTER — Ambulatory Visit
Admission: RE | Admit: 2024-01-23 | Discharge: 2024-01-23 | Disposition: A | Discharge status: Home / Self Care | Attending: Cardiovascular Disease | Admitting: Cardiovascular Disease

## 2024-01-23 ENCOUNTER — Encounter
Admission: RE | Disposition: A | Discharge status: Home / Self Care | Payer: Self-pay | Source: Home / Self Care | Attending: Cardiovascular Disease

## 2024-01-23 ENCOUNTER — Ambulatory Visit: Admitting: Certified Registered Nurse Anesthetist

## 2024-01-23 ENCOUNTER — Ambulatory Visit (HOSPITAL_BASED_OUTPATIENT_CLINIC_OR_DEPARTMENT_OTHER)
Admission: RE | Admit: 2024-01-23 | Discharge: 2024-01-23 | Disposition: A | Discharge status: Home / Self Care | Source: Ambulatory Visit | Attending: Cardiology | Admitting: Cardiology

## 2024-01-23 DIAGNOSIS — I10 Essential (primary) hypertension: Secondary | ICD-10-CM | POA: Insufficient documentation

## 2024-01-23 DIAGNOSIS — I351 Nonrheumatic aortic (valve) insufficiency: Secondary | ICD-10-CM

## 2024-01-23 DIAGNOSIS — I080 Rheumatic disorders of both mitral and aortic valves: Secondary | ICD-10-CM | POA: Diagnosis not present

## 2024-01-23 DIAGNOSIS — I7121 Aneurysm of the ascending aorta, without rupture: Secondary | ICD-10-CM | POA: Insufficient documentation

## 2024-01-23 DIAGNOSIS — R002 Palpitations: Secondary | ICD-10-CM | POA: Diagnosis present

## 2024-01-23 DIAGNOSIS — Z87891 Personal history of nicotine dependence: Secondary | ICD-10-CM | POA: Diagnosis not present

## 2024-01-23 DIAGNOSIS — N400 Enlarged prostate without lower urinary tract symptoms: Secondary | ICD-10-CM | POA: Insufficient documentation

## 2024-01-23 DIAGNOSIS — G8929 Other chronic pain: Secondary | ICD-10-CM | POA: Diagnosis not present

## 2024-01-23 DIAGNOSIS — I340 Nonrheumatic mitral (valve) insufficiency: Secondary | ICD-10-CM

## 2024-01-23 DIAGNOSIS — R0602 Shortness of breath: Secondary | ICD-10-CM

## 2024-01-23 DIAGNOSIS — Z79899 Other long term (current) drug therapy: Secondary | ICD-10-CM | POA: Diagnosis not present

## 2024-01-23 HISTORY — PX: TEE WITHOUT CARDIOVERSION: SHX5443

## 2024-01-23 LAB — ECHO TEE

## 2024-01-23 SURGERY — ECHOCARDIOGRAM, TRANSESOPHAGEAL
Anesthesia: General

## 2024-01-23 MED ORDER — SODIUM CHLORIDE 0.9% FLUSH: 3.0000 mL | Freq: Two times a day (BID) | INTRAVENOUS | Status: DC

## 2024-01-23 MED ORDER — SODIUM CHLORIDE 0.9 % IV SOLN: INTRAVENOUS | Status: DC | PRN

## 2024-01-23 MED ORDER — BUTAMBEN-TETRACAINE-BENZOCAINE 2-2-14 % EX AERO
INHALATION_SPRAY | CUTANEOUS | Status: AC
  Filled 2024-01-23: qty 5

## 2024-01-23 MED ORDER — PROPOFOL 1000 MG/100ML IV EMUL
INTRAVENOUS | Status: AC
Start: 2024-01-23 — End: 2024-01-23
  Filled 2024-01-23: qty 200

## 2024-01-23 MED ORDER — SODIUM CHLORIDE 0.9% FLUSH: 3.0000 mL | INTRAVENOUS | Status: DC | PRN

## 2024-01-23 MED ORDER — LIDOCAINE VISCOUS HCL 2 % MT SOLN
OROMUCOSAL | Status: AC
  Filled 2024-01-23: qty 15

## 2024-01-23 MED ORDER — PROPOFOL 10 MG/ML IV BOLUS
INTRAVENOUS | Status: DC | PRN
Start: 2024-01-23 — End: 2024-01-23
  Administered 2024-01-23: 40 mg via INTRAVENOUS
  Administered 2024-01-23 (×4): 30 mg via INTRAVENOUS
  Administered 2024-01-23: 40 mg via INTRAVENOUS

## 2024-01-23 NOTE — Progress Notes (Addendum)
 Transesophageal Echocardiogram :  Indication: Severe aortic valve regurgitation Requesting/ordering  physician: Tylene Lunch  Procedure: Benzocaine  spray x2 and 2 mls x 2 of viscous lidocaine  were given orally to provide local anesthesia to the oropharynx. The patient was positioned supine on the left side, bite block provided. The patient was moderately sedated with the doses of versed and fentanyl as detailed below.  Using digital technique an omniplane probe was advanced into the distal esophagus without incident.   Moderate sedation: 1. Sedation used: Propofol  per anesthesia team  See report in EPIC  for complete details: In brief, transgastric imaging revealed normal LV function with no RWMAs and no mural apical thrombus.  .  Estimated ejection fraction was 50%.  Right sided cardiac chambers were normal with no evidence of pulmonary hypertension.  Tricuspid aortic valve, right coronary cusp mildly calcified, coapts poorly, moderate to severe regurgitation  Mild dilation of aortic root and ascending aorta 4.1 cm  Imaging of the septum showed no ASD or VSD Bubble study was negative for shunt 2D and color flow confirmed no PFO  The LA was well visualized in orthogonal views.  There was no spontaneous contrast and no thrombus in the LA and LA appendage   The descending thoracic aorta had no  mural aortic debris with no evidence of aneurysmal dilation or disection   Reginald Mckee 01/23/2024 1:23 PM

## 2024-01-23 NOTE — Anesthesia Postprocedure Evaluation (Signed)
 Anesthesia Post Note  Patient: Radiographer, therapeutic  Procedure(s) Performed: ECHOCARDIOGRAM, TRANSESOPHAGEAL  Patient location during evaluation: PACU Anesthesia Type: General Level of consciousness: awake and alert Pain management: pain level controlled Vital Signs Assessment: post-procedure vital signs reviewed and stable Respiratory status: spontaneous breathing, nonlabored ventilation and respiratory function stable Cardiovascular status: blood pressure returned to baseline and stable Postop Assessment: no apparent nausea or vomiting Anesthetic complications: no   No notable events documented.   Last Vitals:  Vitals:   01/23/24 1242 01/23/24 1245  BP: (!) 117/49 (!) 94/53  Pulse: (!) 56 (!) 54  Resp: 13 12  Temp:  (!) 36.4 C  SpO2: 98% 97%    Last Pain:  Vitals:   01/23/24 1245  TempSrc: Temporal  PainSc: 0-No pain                 Camellia Merilee Louder

## 2024-01-23 NOTE — Transfer of Care (Signed)
 Immediate Anesthesia Transfer of Care Note  Patient: Reginald Mckee  Procedure(s) Performed: ECHOCARDIOGRAM, TRANSESOPHAGEAL  Patient Location: [facility]  Anesthesia Type:General  Level of Consciousness: awake, alert , drowsy, and patient cooperative  Airway & Oxygen Therapy: Patient Spontanous Breathing and Patient connected to nasal cannula oxygen  Post-op Assessment: Report given to RN and Post -op Vital signs reviewed and stable  Post vital signs: Reviewed and stable  Last Vitals:  Vitals Value Taken Time  BP 117/49 01/23/24 12:42  Temp    Pulse 56 01/23/24 12:42  Resp 13 01/23/24 12:42  SpO2 98 % 01/23/24 12:42    Last Pain:  Vitals:   01/23/24 1134  TempSrc: Temporal  PainSc: 0-No pain         Complications: No notable events documented.

## 2024-01-23 NOTE — Anesthesia Preprocedure Evaluation (Addendum)
 Anesthesia Evaluation  Patient identified by MRN, date of birth, ID band Patient awake    Reviewed: Allergy & Precautions, H&P , NPO status , Patient's Chart, lab work & pertinent test results  Airway Mallampati: III  TM Distance: >3 FB Neck ROM: full    Dental  (+) Missing   Pulmonary neg pulmonary ROS   Pulmonary exam normal        Cardiovascular hypertension, Normal cardiovascular exam+ dysrhythmias (fluttering in his heart when lying down to sleep) + Valvular Problems/Murmurs AI   ECHO 01/09/2024:  1. Left ventricular ejection fraction, by estimation, is 50 to 55%. Left  ventricular ejection fraction by 3D volume is 55 %. The left ventricle has  low normal function. The left ventricle has no regional wall motion  abnormalities. The left ventricular  internal cavity size was moderately dilated. There is mild concentric left  ventricular hypertrophy. Left ventricular diastolic parameters are  indeterminate. Elevated left atrial pressure. The average left ventricular  global longitudinal strain is -19.2  %. The global longitudinal strain is normal.   2. Right ventricular systolic function is normal. The right ventricular  size is normal. Tricuspid regurgitation signal is inadequate for assessing  PA pressure.   3. The mitral valve is normal in structure. Trivial mitral valve  regurgitation. No evidence of mitral stenosis.   4. The aortic valve is normal in structure. Although the AI PHT is in the  moderate range, the Aortic valve regurgitation appears moderate to severe  visually by colorflow. No aortic stenosis is present. Aortic regurgitation  PHT measures 355 msec. AI  likely related to aortic root dilatation.   5. Aortic dilatation noted. Aneurysm of the aortic root, measuring 45 mm.  There is mild dilatation of the ascending aorta, measuring 42 mm.   6. Consider TEE for further evaluation of severity of AI.    Aneurysmal  dilatation ascending thoracic aorta measuring up to 4.2 cm noted on coronary calcium scoring 2023, aneurysm of the aortic root measuring 45 mm on echocardiogram.    Neuro/Psych negative neurological ROS  negative psych ROS   GI/Hepatic negative GI ROS, Neg liver ROS,,,  Endo/Other  negative endocrine ROS    Renal/GU negative Renal ROS  negative genitourinary   Musculoskeletal   Abdominal  (+) + obese  Peds  Hematology negative hematology ROS (+)   Anesthesia Other Findings Past Medical History: No date: Acid reflux No date: Aneurysm of aorta (HCC) No date: Arthritis No date: BPH (benign prostatic hyperplasia) No date: Hypertension  Past Surgical History: No date: ELBOW SURGERY; Right No date: FINGER SURGERY; Left No date: HERNIA REPAIR No date: prostate artery embolism     Comment:  x 2 at [facility]     Reproductive/Obstetrics negative OB ROS                              Anesthesia Physical Anesthesia Plan  ASA: 3  Anesthesia Plan: General   Post-op Pain Management: Minimal or no pain anticipated   Induction: Intravenous  PONV Risk Score and Plan: Propofol  infusion and TIVA  Airway Management Planned: Natural Airway  Additional Equipment:   Intra-op Plan:   Post-operative Plan:   Informed Consent: I have reviewed the patients History and Physical, chart, labs and discussed the procedure including the risks, benefits and alternatives for the proposed anesthesia with the patient or authorized representative who has indicated his/her understanding and acceptance.     Dental  Advisory Given  Plan Discussed with: CRNA and Surgeon  Anesthesia Plan Comments:          Anesthesia Quick Evaluation

## 2024-01-24 ENCOUNTER — Encounter: Payer: Self-pay | Admitting: Cardiovascular Disease

## 2024-01-24 ENCOUNTER — Ambulatory Visit: Payer: Self-pay | Admitting: Medical

## 2024-01-26 NOTE — Interval H&P Note (Signed)
 History and Physical Interval Note:  01/26/2024 5:50 PM  Reginald Mckee  has presented today for surgery, with the diagnosis of TEE    Palpitations   Severe aortic insufficiency.  The various methods of treatment have been discussed with the patient and family. After consideration of risks, benefits and other options for treatment, the patient has consented to  Procedure(s): ECHOCARDIOGRAM, TRANSESOPHAGEAL (N/A) as a surgical intervention.  The patient's history has been reviewed, patient examined, no change in status, stable for surgery.  I have reviewed the patient's chart and labs.  Questions were answered to the patient's satisfaction.     Oliana Gowens

## 2024-01-26 NOTE — Interval H&P Note (Signed)
 History and Physical Interval Note:  01/26/2024 5:51 PM  Reginald Mckee  has presented today for surgery, with the diagnosis of TEE    Palpitations   Severe aortic insufficiency.  The various methods of treatment have been discussed with the patient and family. After consideration of risks, benefits and other options for treatment, the patient has consented to  Procedure(s): ECHOCARDIOGRAM, TRANSESOPHAGEAL (N/A) as a surgical intervention.  The patient's history has been reviewed, patient examined, no change in status, stable for surgery.  I have reviewed the patient's chart and labs.  Questions were answered to the patient's satisfaction.     Yisel Megill

## 2024-01-26 NOTE — H&P (Signed)
 Patient was seen and evaluated prior to transesophageal echo procedure Symptoms, prior testing details again confirmed with the patient Patient examined, no significant change from prior exam Lab work reviewed in detail personally by myself After careful review of history and examination, the risks and benefits of transesophageal echocardiogram have been explained including risks of esophageal damage, perforation (1:10,000 risk), bleeding, pharyngeal hematoma as well as other potential complications associated with conscious sedation including aspiration, arrhythmia, respiratory failure and death. Alternatives to treatment were discussed, questions were answered.  Patient is willing to proceed.   Signed, Dossie Arbour, MD, Ph.D [facility]

## 2024-02-14 ENCOUNTER — Encounter: Payer: Self-pay | Admitting: Cardiology

## 2024-02-14 ENCOUNTER — Ambulatory Visit: Attending: Cardiology | Admitting: Cardiology

## 2024-02-14 VITALS — BP 140/69 | HR 58 | Resp 18 | Ht 73.0 in | Wt 216.1 lb

## 2024-02-14 DIAGNOSIS — I77810 Thoracic aortic ectasia: Secondary | ICD-10-CM | POA: Diagnosis not present

## 2024-02-14 DIAGNOSIS — I10 Essential (primary) hypertension: Secondary | ICD-10-CM | POA: Diagnosis not present

## 2024-02-14 DIAGNOSIS — R002 Palpitations: Secondary | ICD-10-CM | POA: Diagnosis not present

## 2024-02-14 DIAGNOSIS — I351 Nonrheumatic aortic (valve) insufficiency: Secondary | ICD-10-CM | POA: Diagnosis not present

## 2024-02-14 NOTE — Patient Instructions (Signed)
 Referral to Structural team for mod to severe AR  Medication Instructions:  Your physician recommends that you continue on your current medications as directed. Please refer to the Current Medication list given to you today.   *If you need a refill on your cardiac medications before your next appointment, please call your pharmacy*  Lab Work: No labs ordered today  If you have labs (blood work) drawn today and your tests are completely normal, you will receive your results only by: MyChart Message (if you have MyChart) OR A paper copy in the mail If you have any lab test that is abnormal or we need to change your treatment, we will call you to review the results.  Testing/Procedures: No test ordered today   Follow-Up: At [facility], you and your health needs are our priority.  As part of our continuing mission to provide you with exceptional heart care, our providers are all part of one team.  This team includes your primary Cardiologist (physician) and Advanced Practice Providers or APPs (Physician Assistants and Nurse Practitioners) who all work together to provide you with the care you need, when you need it.  Your next appointment:   3 month(s)  Provider:   Timothy Gollan, MD or Tylene Lunch, NP

## 2024-02-14 NOTE — Progress Notes (Signed)
 Cardiology Office Note   Date:  02/14/2024  ID:  Carole Deere, DOB April 13, 1956, MRN [id number] PCP: [name], MD  [facility] Providers Cardiologist:  Evalene Lunger, MD Cardiology APP:  Gerard Frederick, NP     History of Present Illness Reginald Mckee is a 68 y.o. male with a past medical history of BPH, hypertension, remote smoker, chronic back pain, GERD, pedal edema, palpitations, who presents today for follow-up.   Previously, had coronary CTA completed that revealed coronary calcium score of 53.1.  50% for age and sex matched controls.  Recommend to continue with heart healthy lifestyle and risk factor modification at that time.  Your ZIO monitor which revealed sinus rhythm with rare episodes of tachycardia lasting several seconds at a time on average 3 a day, rare extra beats noted and no atrial fibrillation noted.  He was evaluated in clinic 07/13/2023 at that time with complaints of palpitations and elevated blood pressure and chest tightness.  He had noticed some fluttering when he laid down.  He did stop his blood pressure medication over the summer because he was getting orthostatic symptoms.  Blood pressure was noted to be elevated at 150 systolic.  He was restarted on his amlodipine /valsartan  10/320 mg daily and placed on a ZIO monitor.   He was last seen in clinic 01/15/2024.  He still continues to have fluttering and palpitations.  Echocardiogram completed revealing LVEF 50-55%, no RWMA, moderately dilated left ventricular internal cavity size, trivial MR, elevated left atrial pressure.  Moderate to severe aortic regurgitation although a high PHT is in the moderate range PHT measuring 355 ms.  Likely related to aortic root dilatation.  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  He was scheduled for TEE for further evaluation and severity of his AI.  TEE was completed and revealed an EF of 50 to 25%, no RWMA, left ventricular  internal cavity size was mildly dilated, mild LVH, normal right ventricular systolic function and size, no left atrial appendage thrombus was detected, aortic valve regurgitation is moderate to severe directed eccentrically under the anterior mitral valve leaflet with no aortic stenosis.  He returns to clinic today stating that overall for the cardiac perspective he has been doing well.  He occasionally has some palpitations when he lies back in bed at night and peripheral edema noted to his ankles throughout the day that resolves in the next morning.  Denies any chest pain, shortness of breath, dyspnea on exertion, syncope/near syncope.  States that he has been taking his blood pressure medication with improvement in blood pressure.  He has not been of any medications and has reserves related to healthcare.  He has several questions today related to his previous TEE and the need for left heart catheterization.  Denies any hospitalizations or visits to the emergency department.  ROS: 10 point review of system has been reviewed and considered negative with exception ones been listed in the HPI  Studies Reviewed EKG Interpretation Date/Time:  Thursday February 14 2024 08:10:43 EDT Ventricular Rate:  59 PR Interval:  158 QRS Duration:  108 QT Interval:  432 QTC Calculation: 427 R Axis:   -2  Text Interpretation: Sinus bradycardia T wave inversion , lvh, early repolarization When compared with ECG of 15-Jan-2024 08:23, No significant change was found Confirmed by Gerard Frederick ([id number]) on 02/14/2024 8:13:19 AM    TEE 01/23/2024 1. Left ventricular ejection fraction, by estimation, is 50 to 55%. The  left  ventricle has low normal function. The left ventricle has no regional  wall motion abnormalities. The left ventricular internal cavity size was  mildly dilated. There is mild  left ventricular hypertrophy.   2. Right ventricular systolic function is normal. The right ventricular  size is normal.    3. No left atrial/left atrial appendage thrombus was detected.   4. The mitral valve is normal in structure. Mild mitral valve  regurgitation. No evidence of mitral stenosis.   5. The aortic valve is tricuspid. There is mild thickening of the distal  right cusp of the aortic valve that coapts poorly. Aortic valve  regurgitation is moderate to severe directed eccentrically under the  anterior mitral valve leaflet. No aortic  stenosis is present.   6. The inferior vena cava is normal in size with greater than 50%  respiratory variability, suggesting right atrial pressure of 3 mmHg.   7. There is mild dilatation of the aortic root, measuring 41 mm. There is  mild dilatation of the ascending aorta, measuring 41 mm.   8. Agitated saline contrast bubble study was negative, with no evidence  of any interatrial shunt.   9. 3D performed of the aortic valve   2D echo 01/09/2024 1. Left ventricular ejection fraction, by estimation, is 50 to 55%. Left  ventricular ejection fraction by 3D volume is 55 %. The left ventricle has  low normal function. The left ventricle has no regional wall motion  abnormalities. The left ventricular  internal cavity size was moderately dilated. There is mild concentric left  ventricular hypertrophy. Left ventricular diastolic parameters are  indeterminate. Elevated left atrial pressure. The average left ventricular  global longitudinal strain is -19.2  %. The global longitudinal strain is normal.   2. Right ventricular systolic function is normal. The right ventricular  size is normal. Tricuspid regurgitation signal is inadequate for assessing  PA pressure.   3. The mitral valve is normal in structure. Trivial mitral valve  regurgitation. No evidence of mitral stenosis.   4. The aortic valve is normal in structure. Although the AI PHT is in the moderate range, the Aortic valve regurgitation appears moderate to severe visually by colorflow. No aortic stenosis is  present. Aortic regurgitation PHT measures 355 msec. AI likely related to aortic root dilatation.   5. Aortic dilatation noted. Aneurysm of the aortic root, measuring 45 mm. There is mild dilatation of the ascending aorta, measuring 42 mm.   6. Consider TEE for further evaluation of severity of AI.    Event Monitor (Zio) 08/13/2023 Normal sinus rhythm Patient had a min HR of 49 bpm, max HR of 190 bpm, and avg HR of 70 bpm.  Bundle Branch Block/IVCD was present.    1 run of Ventricular Tachycardia occurred lasting 4 beats with a max rate of 179 bpm (avg 171 bpm).    39 Supraventricular Tachycardia runs occurred, the run with the fastest interval lasting 8 beats with a max rate of 190 bpm, the longest lasting 14 beats with an avg rate of 121 bpm.    Isolated SVEs were rare (<1.0%), SVE Couplets were rare (<1.0%), and SVE Triplets were rare (<1.0%).    Isolated VEs were rare (<1.0%, 898), VE Couplets were rare (<1.0%, 25), and VE Triplets were rare (<1.0%, 2).  Ventricular Bigeminy was present.   No patient triggered events recorded   cCTA 09/20/2021 IMPRESSION AND RECOMMENDATION: 1. Coronary calcium score of 53.1. This was 50th percentile for age and sex matched control.  2. CAC 1-99 in LM. CAC-DRS A1/N1.   3. Continue heart healthy lifestyle and risk factor modification. Risk Assessment/Calculations     Physical Exam VS:  BP (!) 140/69 (BP Location: Left Arm, Patient Position: Sitting, Cuff Size: Normal)   Pulse (!) 58   Resp 18   Ht 6' 1 (1.854 m)   Wt 216 lb 0.8 oz (98 kg)   SpO2 98%   BMI 28.50 kg/m        Wt Readings from Last 3 Encounters:  02/14/24 216 lb 0.8 oz (98 kg)  01/23/24 241 lb 14.4 oz (109.7 kg)  01/21/24 220 lb (99.8 kg)    GEN: Well nourished, well developed in no acute distress NECK: No JVD; No carotid bruits CARDIAC: RRR, II/VI systolic murmur RSB, without rubs or gallops RESPIRATORY:  Clear to auscultation without rales, wheezing or rhonchi   ABDOMEN: Soft, non-tender, non-distended EXTREMITIES:  No edema; No deformity   ASSESSMENT AND PLAN Primary hypertension with blood pressure today 140/69 which is improving since he has been compliant with his current medication regimen.  He is continued on amlodipine  valsartan  10/320 mg daily.  Encouraged to monitor pressures 1 to 2 hours postmedication administration at home as well.  Palpitations occasionally at night when he lies down.  Previously wore a ZIO XT monitor which revealed normal sinus rhythm with rare episodes of tachycardia lasting several seconds.  EKG today reveals sinus bradycardia with a rate of 59 with T wave inversions and LVH with no acute changes noted.  Would likely benefit from low-dose of beta-blocker therapy but is not a fan of taking medications.  Deferred addition of medications until follow-up with structural team.  Moderate to severe aortic valve regurgitation noted on recent echocardiogram with an LVEF of 50 to 55%, no RWMA, mild concentric LVH, trivial mitral regurgitation, aortic valve regurgitation.  Moderate to severe likely related to the aortic root dilatation.  He was scheduled for recent TEE which revealed LVEF of 50-55%, mild left ventricular hypertrophy, RV SF normal function and size, no atrial/left atrial appendage thrombus was detected, mitral valve is normal in structure with mild mitral valve regurgitation, mild thickening of the distal right cusp of the aortic valve that collapses poorly, aortic valve regurgitation is moderate to severe directed concentrically under the anterior mitral valve leaflet no aortic stenosis or was present, mild dilatation of the aortic root measuring 41 mm of mild dilatation of the ascending aorta measuring 41 mm, negative bubble study.  Recommendation was for left heart catheterization and referral to structural heart team for nonrheumatic valvular heart disease.  Patient had several questions related to left heart  catheterization and after discussion of current symptoms, procedure, and TEE results left heart catheterization has been placed on hold per patient's request and he is agreeable to referral to structural heart team to discuss next steps.  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  Blood pressure control has been stressed.  He is being sent to structural for further evaluation and next steps on aneurysm valve repair/replacement.       Dispo: Patient to return to clinic to see MD/APP in 3 months or sooner if needed for further evaluation after he is seen in the structural heart team.  Signed, Corbet Hanley, NP

## 2024-02-19 ENCOUNTER — Ambulatory Visit: Attending: Cardiovascular Disease | Admitting: Cardiovascular Disease

## 2024-02-19 ENCOUNTER — Encounter: Payer: Self-pay | Admitting: Cardiovascular Disease

## 2024-02-19 VITALS — BP 148/60 | HR 68 | Ht 73.0 in | Wt 217.0 lb

## 2024-02-19 DIAGNOSIS — R002 Palpitations: Secondary | ICD-10-CM

## 2024-02-19 DIAGNOSIS — I10 Essential (primary) hypertension: Secondary | ICD-10-CM

## 2024-02-19 DIAGNOSIS — I712 Thoracic aortic aneurysm, without rupture, unspecified: Secondary | ICD-10-CM

## 2024-02-19 DIAGNOSIS — I351 Nonrheumatic aortic (valve) insufficiency: Secondary | ICD-10-CM | POA: Diagnosis not present

## 2024-02-19 DIAGNOSIS — I77810 Thoracic aortic ectasia: Secondary | ICD-10-CM

## 2024-02-19 NOTE — Patient Instructions (Addendum)
 Medication Instructions:   No changes  If you need a refill on your cardiac medications before your next appointment, please call your pharmacy.   Lab work: No new labs needed  Testing/Procedures:  Your physician has requested that you have an echocardiogram in 6 months. Echocardiography is a painless test that uses sound waves to create images of your heart. It provides your doctor with information about the size and shape of your heart and how well your heart's chambers and valves are working.   You may receive an ultrasound enhancing agent through an IV if needed to better visualize your heart during the echo. This procedure takes approximately one hour.  There are no restrictions for this procedure.  This will take place at [address] [facility]) [address], [place] [address]  Please note: We ask at that you not bring children with you during ultrasound (echo/ vascular) testing. Due to room size and safety concerns, children are not allowed in the ultrasound rooms during exams. Our front office staff cannot provide observation of children in our lobby area while testing is being conducted. An adult accompanying a patient to their appointment will only be allowed in the ultrasound room at the discretion of the ultrasound technician under special circumstances. We apologize for any inconvenience.   Follow-Up: At [facility], you and your health needs are our priority.  As part of our continuing mission to provide you with exceptional heart care, we have created designated Provider Care Teams.  These Care Teams include your primary Cardiologist (physician) and Advanced Practice Providers (APPs -  Physician Assistants and Nurse Practitioners) who all work together to provide you with the care you need, when you need it.  You will need a follow up appointment in 6 months, gollan after echo  Providers on your designated Care Team:   Lonni Meager, NP Bernardino Bring,  PA-C Cadence [place], [place]  COVID-19 Vaccine Information can be found at: [email or web address] For questions related to vaccine distribution or appointments, please email [email or web address] or call [phone].

## 2024-02-19 NOTE — Progress Notes (Signed)
 Cardiology Office Note  Date:  02/19/2024   ID:  Reginald Mckee, DOB 08/17/1955, MRN [id number]  PCP:  [name], MD  Urology:[facility]  Chief Complaint  Patient presents with   Discuss Cardiac Cath     Patient c/o tiredness, breaking out into a sweat more often, has occasional dizziness and feels fatigue moe often.      HPI:  [name] Is a 68 year old gentleman with past medical history of BPH, Elevated PSA Hypertension Remote social smoker Chronic back problem GERD History of leg swelling Calcium score 53 in march 2023 Aorta dilation  Moderate to severe AI Presents for f/u of his hypertension, palpitations/fluttering  LOV 1/25 calcium score March 2023 ascending aorta 4.2 cm  Transthoracic echocardiogram July 2025 Ascending aorta 4.5 cm, significant aortic valve regurgitation  TEE aorta estimated 4.1 cm Moderate to severe aortic valve regurgitation  On discussion, reports he is active at baseline Recently cutting down a tree on his property Gets more fatigued in the hot humid weather of summer Was sedentary over the winter, trying to get himself back into shape Denies any PND orthopnea, no lower extremity edema No significant shortness of breath or chest pain on basic activities, low-level exertion  Retired Reports difficulty getting accurate blood pressures with his home blood pressure cuff, goes to a local pharmacy  EKG personally reviewed by myself on todays visit EKG Interpretation Date/Time:  Tuesday February 19 2024 15:32:35 EDT Ventricular Rate:  68 PR Interval:  152 QRS Duration:  108 QT Interval:  370 QTC Calculation: 393 R Axis:   0  Text Interpretation: Normal sinus rhythm Minimal voltage criteria for LVH, may be normal variant ( Cornell product ) When compared with ECG of 14-Feb-2024 08:10, Nonspecific T wave abnormality has replaced inverted T waves in Lateral leads Confirmed by Perla Lye [phone]) on 02/19/2024 3:59:43 PM     PMH:   has a past medical history of Acid reflux, Aneurysm of aorta (HCC), Arthritis, BPH (benign prostatic hyperplasia), and Hypertension.  PSH:    Past Surgical History:  Procedure Laterality Date   ELBOW SURGERY Right    FINGER SURGERY Left    HERNIA REPAIR     prostate artery embolism     x 2 at [facility]   TEE WITHOUT CARDIOVERSION N/A 01/23/2024   Procedure: ECHOCARDIOGRAM, TRANSESOPHAGEAL;  Surgeon: Perla Lye PARAS, MD;  Location: [facility] ORS;  Service: Cardiovascular;  Laterality: N/A;    Current Outpatient Medications  Medication Sig Dispense Refill   amLODipine -valsartan  (EXFORGE ) 10-320 MG tablet Take 1 tablet by mouth daily. 90 tablet 4   fluorouracil  (EFUDEX ) 5 % cream Apply topically 2 (two) times daily. 40 g 0   psyllium (METAMUCIL) 58.6 % packet Take 1 packet by mouth daily.     No current facility-administered medications for this visit.   Allergies:   Keflex  [cephalexin ]   Social History:  The patient  reports that he has never smoked. He has quit using smokeless tobacco. He reports current alcohol use. He reports that he does not use drugs.   Family History:   family history includes Atrial fibrillation in his father.    Review of Systems: Review of Systems  Constitutional: Negative.   HENT: Negative.    Respiratory: Negative.    Cardiovascular:  Positive for palpitations.  Gastrointestinal: Negative.   Musculoskeletal: Negative.   Neurological: Negative.   Psychiatric/Behavioral: Negative.    All other systems reviewed and are negative.   PHYSICAL EXAM: VS:  BP ROLLEN)  148/60 (BP Location: Right Arm, Patient Position: Sitting, Cuff Size: Normal)   Pulse 68   Ht 6' 1 (1.854 m)   Wt 217 lb (98.4 kg)   SpO2 98%   BMI 28.63 kg/m  , BMI Body mass index is 28.63 kg/m. Constitutional:  oriented to person, place, and time. No distress.  HENT:  Head: Grossly normal Eyes:  no discharge. No scleral icterus.  Neck: No JVD, no carotid bruits  Cardiovascular:  Regular rate and rhythm, no murmurs appreciated Pulmonary/Chest: Clear to auscultation bilaterally, no wheezes or rales Abdominal: Soft.  no distension.  no tenderness.  Musculoskeletal: Normal range of motion Neurological:  normal muscle tone. Coordination normal. No atrophy Skin: Skin warm and dry Psychiatric: normal affect, pleasant   Recent Labs: 11/22/2023: ALT 10 01/15/2024: BUN 17; Creatinine, Ser 0.88; Hemoglobin 16.0; Platelets 202; Potassium 4.6; Sodium 142    Lipid Panel Lab Results  Component Value Date   CHOL 172 11/22/2023   HDL 64 11/22/2023   LDLCALC 89 11/22/2023   TRIG 91 11/22/2023      Wt Readings from Last 3 Encounters:  02/19/24 217 lb (98.4 kg)  02/14/24 216 lb 0.8 oz (98 kg)  01/23/24 241 lb 14.4 oz (109.7 kg)      ASSESSMENT AND PLAN:  Essential HTN-  Recommend he continue amlodipine  valsartan  100/320 daily, typically takes this in the evenings Close monitoring of blood pressure at home and call us  with numbers  Aortic valve regurgitation, severe For the most part is asymptomatic, likely present for many years Left ventricle with  dilation, low normal ejection fraction As he is asymptomatic, he is inclined not to proceed with SAVR at this time Recommend recheck echo in 6 months time  Dilated aorta, ascending Mild dilation on prior CT scan 2023 and recent TEE Recommend monitoring on annual basis  Palpitations No atrial fibrillation noted on prior monitor  Benign prostatic hyperplasia, unspecified whether lower urinary tract symptoms present Managed by urology  GERD Reports symptoms stable Takes vinegar  Orders Placed This Encounter  Procedures   EKG 12-Lead     Signed, Velinda Lunger, M.D., Ph.D. 02/19/2024  [facility] [place], [place] [phone]

## 2024-02-29 ENCOUNTER — Encounter: Admitting: Family Medicine

## 2024-03-04 ENCOUNTER — Ambulatory Visit: Admitting: Cardiology

## 2024-05-16 ENCOUNTER — Ambulatory Visit: Admitting: Cardiovascular Disease

## 2024-06-11 DIAGNOSIS — Z8639 Personal history of other endocrine, nutritional and metabolic disease: Secondary | ICD-10-CM | POA: Insufficient documentation

## 2024-07-22 ENCOUNTER — Ambulatory Visit: Admitting: Urology

## 2024-07-22 ENCOUNTER — Other Ambulatory Visit: Payer: Self-pay | Admitting: Cardiovascular Disease

## 2024-07-22 ENCOUNTER — Encounter: Payer: Self-pay | Admitting: Urology

## 2024-07-22 VITALS — BP 155/75 | HR 66 | Ht 72.0 in | Wt 210.0 lb

## 2024-07-22 DIAGNOSIS — R399 Unspecified symptoms and signs involving the genitourinary system: Secondary | ICD-10-CM

## 2024-07-22 DIAGNOSIS — N400 Enlarged prostate without lower urinary tract symptoms: Secondary | ICD-10-CM

## 2024-07-22 DIAGNOSIS — N390 Urinary tract infection, site not specified: Secondary | ICD-10-CM

## 2024-07-22 LAB — MICROSCOPIC EXAMINATION: WBC, UA: >30 /HPF — AB (ref 0–5)

## 2024-07-22 LAB — URINALYSIS, COMPLETE
Bilirubin, UA: NEGATIVE
Glucose, UA: NEGATIVE
Ketones, UA: NEGATIVE
Nitrite, UA: POSITIVE — AB
Specific Gravity, UA: 1.025 (ref 1.005–1.030)
Urobilinogen, Ur: 0.2 mg/dL (ref 0.2–1.0)
pH, UA: 6.5 (ref 5.0–7.5)

## 2024-07-22 LAB — BLADDER SCAN AMB NON-IMAGING: Scan Result: 0

## 2024-07-22 MED ORDER — SULFAMETHOXAZOLE-TRIMETHOPRIM 800-160 MG PO TABS: 1.0000 | ORAL_TABLET | Freq: Two times a day (BID) | ORAL | 0 refills | Status: AC

## 2024-07-22 NOTE — Progress Notes (Signed)
 "  07/22/2024 5:55 PM   Reginald Mckee [birth year] [id number]  Referring provider: [name], MD [address] [facility] [address] [place],  [place] [address]  Chief Complaint  Patient presents with   Benign Prostatic Hypertrophy   Urologic history: 1.  Elevated PSA PSA low to mid 4s; has elected surveillance MRI 12/19; 84 cc gland; no indeterminant/suspicious lesions PSA decreased to 1.8 post PAE   2.  BPH with lower urinary tract symptoms PAE at [facility] 2022   HPI: Reginald Mckee is a 69 y.o. male who presents for a follow-up visit.  Urologic history as above. 3-4 month history of recurrent lower urinary tract symptoms which have progressively worsened. Most bothersome symptoms are urinary frequency, urgency and nocturia x 4; no bothersome obstructive voiding symptoms.  He recently has been voiding 25-30 times per day No dysuria but has a sensation of being recently catheterized and pelvic discomfort IPSS today 19/35 Last PSA 11/22/2023 was 2.67   PMH: Past Medical History:  Diagnosis Date   Acid reflux    Aneurysm of aorta    Arthritis    BPH (benign prostatic hyperplasia)    Hypertension     Surgical History: Past Surgical History:  Procedure Laterality Date   ELBOW SURGERY Right    FINGER SURGERY Left    HERNIA REPAIR     prostate artery embolism     x 2 at [facility]   TEE WITHOUT CARDIOVERSION N/A 01/23/2024   Procedure: ECHOCARDIOGRAM, TRANSESOPHAGEAL;  Surgeon: Perla Evalene PARAS, MD;  Location: [facility] ORS;  Service: Cardiovascular;  Laterality: N/A;    Home Medications:  Allergies as of 07/22/2024       Reactions   Keflex  [cephalexin ]    Unknown Reaction         Medication List        Accurate as of July 22, 2024  5:55 PM. If you have any questions, ask your nurse or doctor.          amLODipine -valsartan  10-320 MG tablet Commonly known as: EXFORGE  Take 1 tablet by mouth daily.   fluorouracil  5 % cream Commonly known as: Efudex  Apply topically 2  (two) times daily.   psyllium 58.6 % packet Commonly known as: METAMUCIL Take 1 packet by mouth daily.        Allergies: Allergies[1]  Family History: Family History  Problem Relation Age of Onset   Atrial fibrillation Father    Prostate cancer Neg Hx    Bladder Cancer Neg Hx    Kidney cancer Neg Hx     Social History:  reports that he has never smoked. He has quit using smokeless tobacco. He reports current alcohol use. He reports that he does not use drugs.   Physical Exam: BP (!) 155/75   Pulse 66   Ht 6' (1.829 m)   Wt 210 lb (95.3 kg)   BMI 28.48 kg/m   Constitutional:  Alert, No acute distress. HEENT: [place] AT Respiratory: Normal respiratory effort, no increased work of breathing. Psychiatric: Normal mood and affect.  Laboratory Data:  Urinalysis Pending   Assessment & Plan:    1.  Lower urinary tract symptoms Bothersome storage related voiding symptoms x 3-4 months PVR 0 mL Trial Gemtesa 75 mg daily Recommend cystoscopy if no improvement in voiding symptoms He did reach out to the radiologist at [facility] who performed his PAE and was informed that he could undergo a repeat treatment but they would want to see a CT scan initially.  He  wanted to hold off on cystoscopy until further evaluation for possible repeat PAE  2.  Elevated BP Initial blood pressure was 171/74; repeat 155/75 PCP follow-up recommended  Addendum: Urinalysis returned consistent with infection with >30 WBC and nitrite positive urine.  Urine culture ordered and Rx Septra  DS sent to pharmacy.  Patient was contacted   Reginald JAYSON Barba, MD  [facility] [address], [address] [place], [place] [address] [phone]     [1]  Allergies Allergen Reactions   Keflex  [Cephalexin ]     Unknown Reaction    "

## 2024-07-22 NOTE — Progress Notes (Signed)
 Patient presents for an office visit. BP today is High. Greater than 140/90. Provider  notified and recheck Blood Pressure .  Pt advised to talk with PCP.  Pt voiced understanding.

## 2024-07-25 LAB — CULTURE, URINE COMPREHENSIVE

## 2024-07-26 ENCOUNTER — Ambulatory Visit: Payer: Self-pay | Admitting: Urology

## 2024-08-07 ENCOUNTER — Encounter: Payer: Self-pay | Admitting: Urology

## 2024-08-07 NOTE — Telephone Encounter (Signed)
 Make a appt for patient he is stilling cloudiness and burning .

## 2024-08-12 ENCOUNTER — Ambulatory Visit: Admitting: Physician Assistant

## 2024-08-21 ENCOUNTER — Other Ambulatory Visit

## 2024-08-25 ENCOUNTER — Ambulatory Visit: Admitting: Cardiovascular Disease
